# Patient Record
Sex: Female | Born: 2000
Health system: Southern US, Community
[De-identification: ages and names within clinical notes are randomized; demographics above are authoritative.]

## PROBLEM LIST (undated history)

## (undated) DIAGNOSIS — F32A Depression, unspecified: Secondary | ICD-10-CM

## (undated) DIAGNOSIS — M549 Dorsalgia, unspecified: Secondary | ICD-10-CM

## (undated) DIAGNOSIS — F319 Bipolar disorder, unspecified: Secondary | ICD-10-CM

## (undated) DIAGNOSIS — I10 Essential (primary) hypertension: Secondary | ICD-10-CM

## (undated) DIAGNOSIS — F419 Anxiety disorder, unspecified: Secondary | ICD-10-CM

## (undated) DIAGNOSIS — R7303 Prediabetes: Secondary | ICD-10-CM

## (undated) DIAGNOSIS — H52209 Unspecified astigmatism, unspecified eye: Secondary | ICD-10-CM

## (undated) DIAGNOSIS — R454 Irritability and anger: Secondary | ICD-10-CM

## (undated) DIAGNOSIS — F909 Attention-deficit hyperactivity disorder, unspecified type: Secondary | ICD-10-CM

## (undated) DIAGNOSIS — K5900 Constipation, unspecified: Secondary | ICD-10-CM

## (undated) DIAGNOSIS — E739 Lactose intolerance, unspecified: Secondary | ICD-10-CM

## (undated) DIAGNOSIS — F329 Major depressive disorder, single episode, unspecified: Secondary | ICD-10-CM

## (undated) HISTORY — DX: Lactose intolerance, unspecified: E73.9

## (undated) HISTORY — DX: Bipolar disorder, unspecified: F31.9

## (undated) HISTORY — DX: Essential (primary) hypertension: I10

## (undated) HISTORY — DX: Dorsalgia, unspecified: M54.9

## (undated) HISTORY — DX: Constipation, unspecified: K59.00

## (undated) HISTORY — PX: TONSILLECTOMY: SUR1361

---
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
## 2019-01-24 IMAGING — CT CT HEAD W/O CM
1 series · 16 of 30 positions shown, 20 images · non-contrast
Comparison: None.

CLINICAL DATA: Persistent headaches

EXAM:
CT HEAD WITHOUT CONTRAST
TECHNIQUE: Contiguous axial images were obtained from the base of the skull
through the vertex without intravenous contrast.

[Series 2: head w/(date) · axial · 0.49mm/px · z∈[-104,+46]mm · 16 of 34 slices shown, 20 images]
[im 2/34  brain]
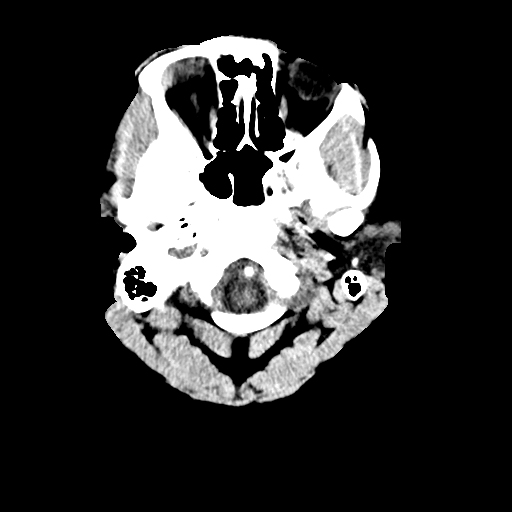
[im 2/34  bone]
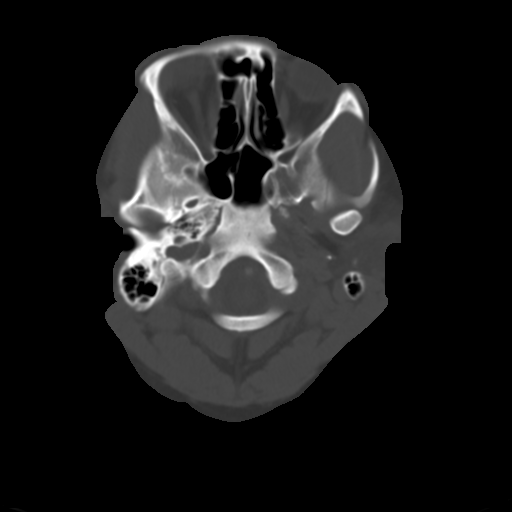
[im 4/34  brain]
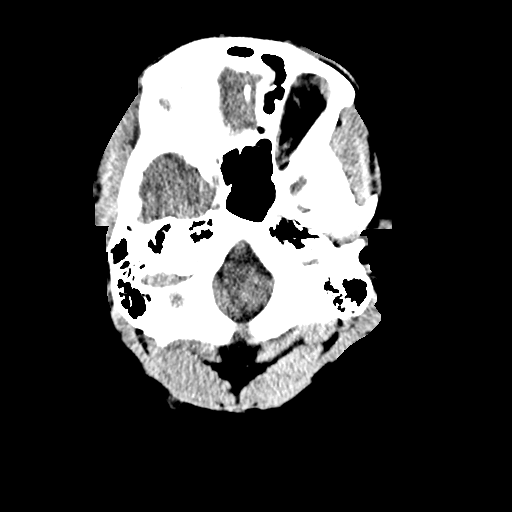
[im 6/34  brain]
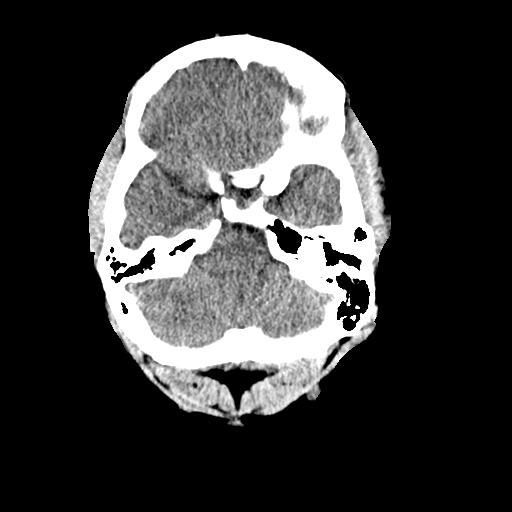
[im 8/34  brain]
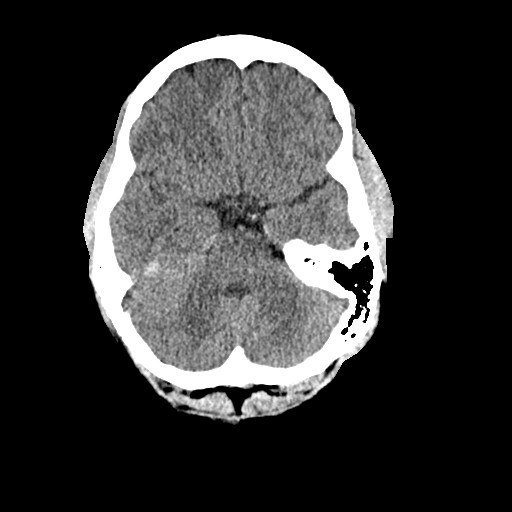
[im 10/34  brain]
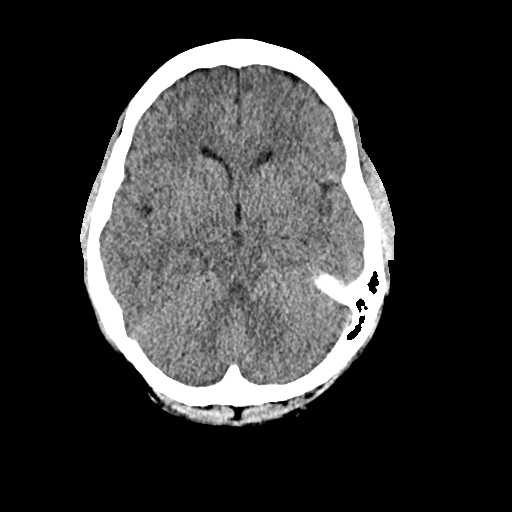
[im 10/34  bone]
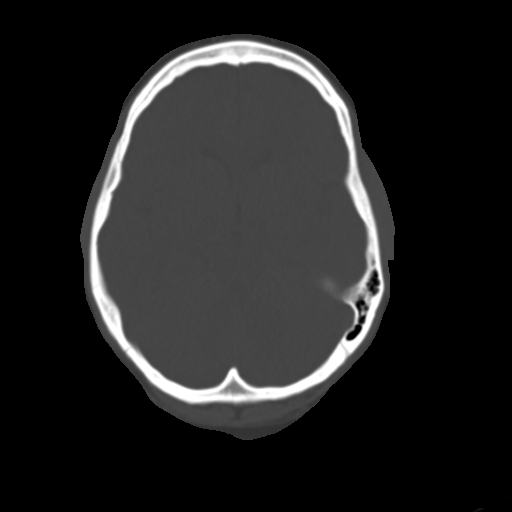
[im 12/34  brain]
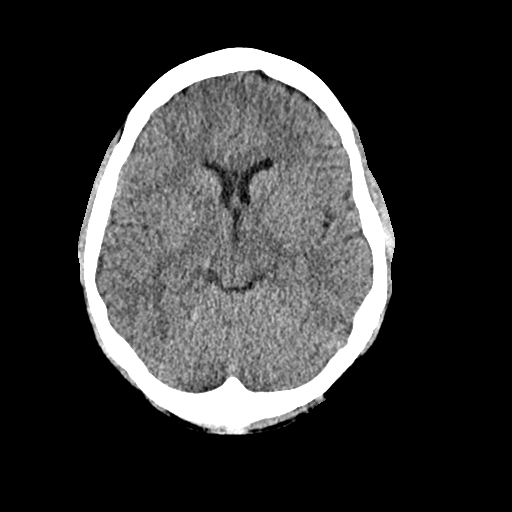
[im 14/34  brain]
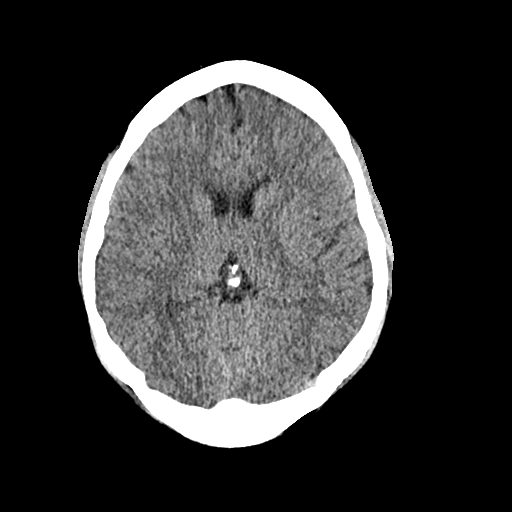
[im 16/34  brain]
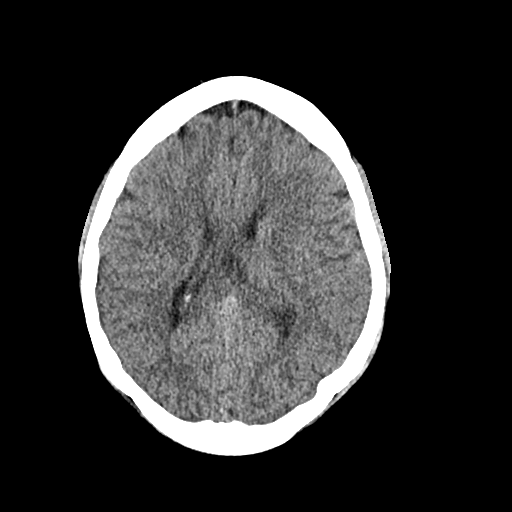
[im 18/34  brain]
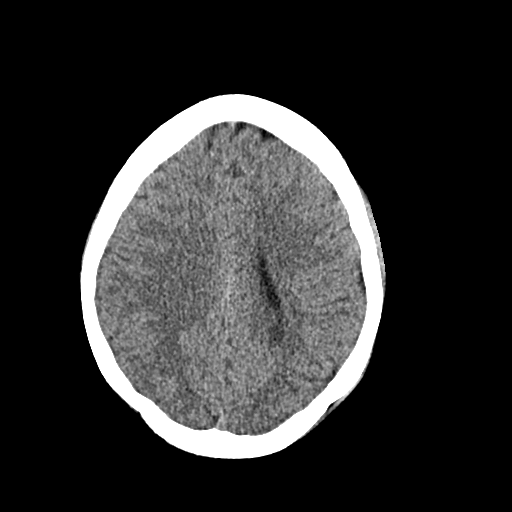
[im 18/34  bone]
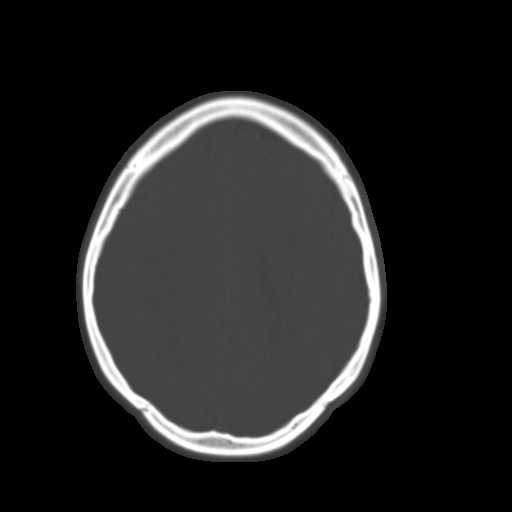
[im 20/34  brain]
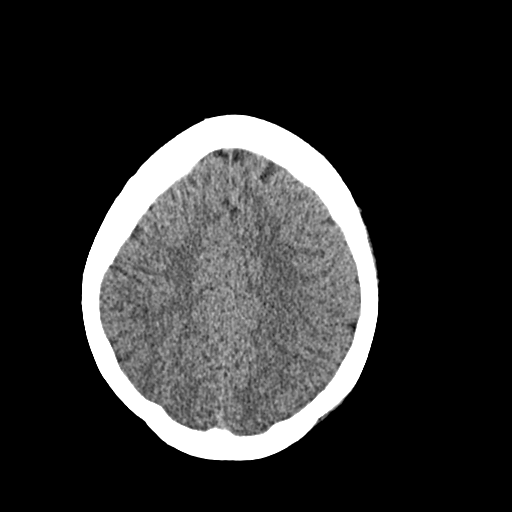
[im 22/34  brain]
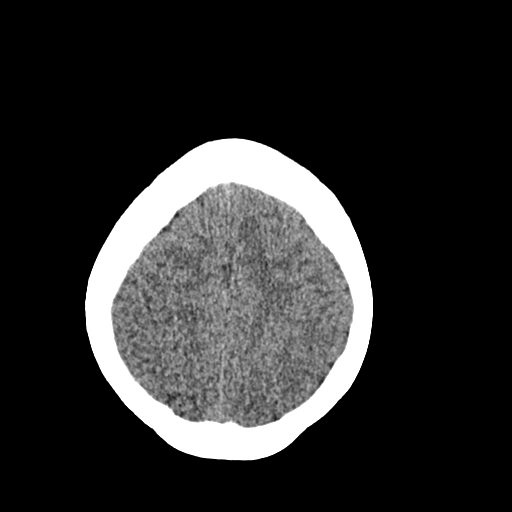
[im 24/34  brain]
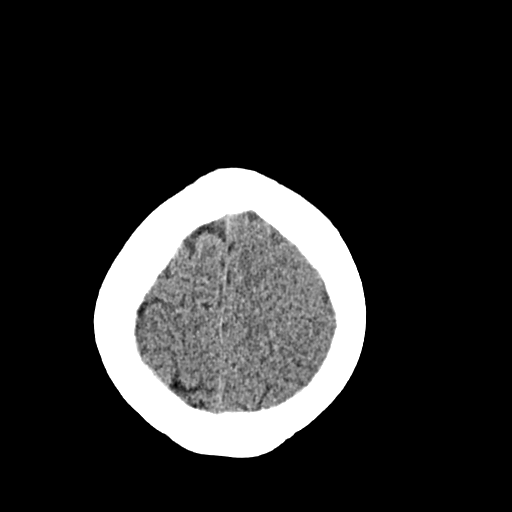
[im 26/34  brain]
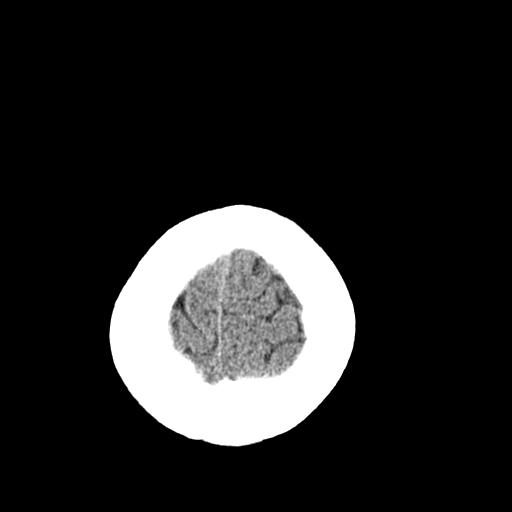
[im 26/34  bone]
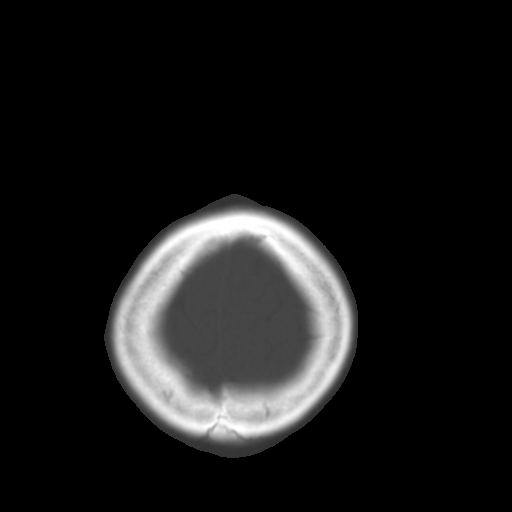
[im 28/34  brain]
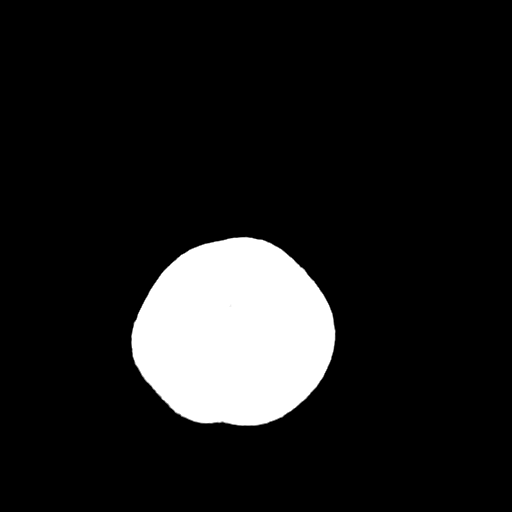
[im 30/34  brain]
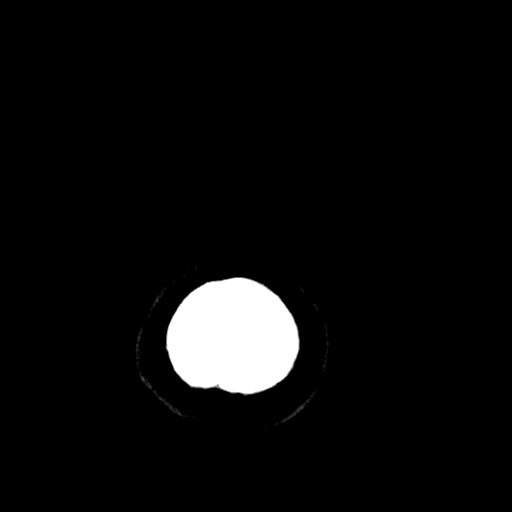
[im 32/34  brain]
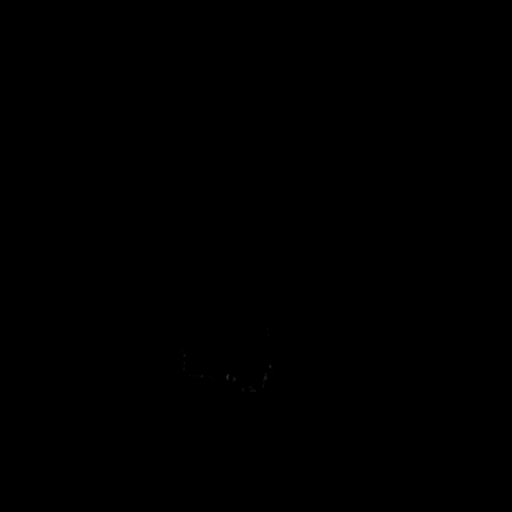

[16 of 30 positions shown; findings below may reference images not displayed]

FINDINGS: Brain: No evidence of acute infarction, hemorrhage, hydrocephalus,
extra-axial collection or mass lesion/mass effect.

Vascular: No hyperdense vessel or unexpected calcification.

Skull: Normal. Negative for fracture or focal lesion.

Sinuses/Orbits: No acute finding.

Other: None.
IMPRESSION: No acute intracranial abnormality noted.

## 2020-03-26 ENCOUNTER — Ambulatory Visit (INDEPENDENT_AMBULATORY_CARE_PROVIDER_SITE_OTHER): Payer: Medicare Other

## 2020-03-26 ENCOUNTER — Ambulatory Visit (HOSPITAL_COMMUNITY)
Admission: EM | Admit: 2020-03-26 | Discharge: 2020-03-26 | Disposition: A | Discharge status: Home / Self Care | Payer: Medicare Other

## 2020-03-26 ENCOUNTER — Encounter (HOSPITAL_COMMUNITY): Payer: Self-pay

## 2020-03-26 ENCOUNTER — Other Ambulatory Visit: Payer: Self-pay

## 2020-03-26 DIAGNOSIS — J069 Acute upper respiratory infection, unspecified: Secondary | ICD-10-CM

## 2020-03-26 DIAGNOSIS — R05 Cough: Secondary | ICD-10-CM | POA: Diagnosis not present

## 2020-03-26 MED ORDER — PROMETHAZINE-DM 6.25-15 MG/5ML PO SYRP: 5.0000 mL | ORAL_SOLUTION | Freq: Every evening | ORAL | 0 refills | Status: DC | PRN

## 2020-03-26 MED ORDER — BENZONATATE 100 MG PO CAPS: 100.0000 mg | ORAL_CAPSULE | Freq: Three times a day (TID) | ORAL | 0 refills | Status: DC | PRN

## 2020-03-26 MED ORDER — PREDNISONE 20 MG PO TABS: ORAL_TABLET | ORAL | 0 refills | Status: DC

## 2020-03-26 NOTE — ED Provider Notes (Signed)
[facility]   MRN: [id number] DOB: Nov 20, 2000  Subjective:   Misty Moses is a 19 y.o. female presenting for 3-week history of persistent dry cough, wheezing at night.  Also has throat discomfort.  Has been tested for Covid 19 twice and both were negative.  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  Denies history of asthma.  Denies being sexually active, no chance of pregnancy.  No current facility-administered medications for this encounter.  Current Outpatient Medications:  .  cloNIDine HCl (KAPVAY) 0.1 MG TB12 ER tablet, Take 1 tablet (0.1 mg total) by mouth 2 (two) times daily in the am and at bedtime.., Disp: 60 tablet, Rfl: 0 .  escitalopram (LEXAPRO) 10 MG tablet, Take 1 tablet (10 mg total) by mouth daily., Disp: 30 tablet, Rfl: 0 .  guanFACINE (INTUNIV) 1 MG TB24 ER tablet, Take 1 mg by mouth every morning., Disp: , Rfl:  .  Levonorgestrel (KYLEENA) 19.5 MG IUD, 19.5 mg by Intrauterine route once. Every 3 years - LAST INSERTED 2017, Disp: , Rfl:  .  metFORMIN (GLUCOPHAGE-XR) 500 MG 24 hr tablet, Take 1 tablet (500 mg total) by mouth daily with supper., Disp: 30 tablet, Rfl: 0 .  methylphenidate 36 MG PO CR tablet, Take 1 tablet (36 mg total) by mouth daily., Disp: 30 tablet, Rfl: 0 .  Oxcarbazepine (TRILEPTAL) 300 MG tablet, Take 1 tablet (300 mg total) by mouth 2 (two) times daily., Disp: 60 tablet, Rfl: 0 .  topiramate (TOPAMAX) 100 MG tablet, Take 200 mg by mouth 2 (two) times daily. , Disp: , Rfl: 2 .  traZODone (DESYREL) 100 MG tablet, Take 1 tablet (100 mg total) by mouth at bedtime., Disp: 30 tablet, Rfl: 0   Allergies  Allergen Reactions  . Hydrocodone Other (See Comments)    Dizziness    Past Medical History:  Diagnosis Date  . ADD (attention deficit disorder with hyperactivity)   . Anxiety   . Astigmatism   . Borderline diabetes    TAKES METFORMIN AS PRECAUTION  . Depression   . Excessive anger    EXPLOSIVE ANGER MANAGEMENT ISSUES      Past Surgical History:  Procedure Laterality Date  . ANTERIOR CRUCIATE LIGAMENT REPAIR Right 06/04/2017   Procedure: Right knee arthroscopic hamsting anterior cruciate ligament reconstruction with partial  medial menisectomy;  Surgeon: Yolonda Kida, MD;  Location[facility];  Service: Orthopedics;  Laterality: Right;  . TONSILLECTOMY      Family History  Problem Relation Age of Onset  . Migraines Mother   . Anxiety disorder Mother   . ADD / ADHD Mother   . Depression Mother   . Bipolar disorder Mother   . Seizures Neg Hx   . Autism Neg Hx   . Schizophrenia Neg Hx     Social History   Tobacco Use  . Smoking status: Never Smoker  . Smokeless tobacco: Never Used  Vaping Use  . Vaping Use: Never used  Substance Use Topics  . Alcohol use: No  . Drug use: No    ROS   Objective:   Vitals: BP 138/87   Pulse 86   Temp 98.7 F (37.1 C) (Oral)   Resp 18   Ht 5\' 4"  (1.626 m)   Wt 285 lb (129.3 kg)   SpO2 98%   BMI 48.92 kg/m   Physical Exam Constitutional:      General: She is not in acute distress.  Appearance: Normal appearance. She is well-developed. She is obese. She is not ill-appearing, toxic-appearing or diaphoretic.  HENT:     Head: Normocephalic and atraumatic.     Nose: Nose normal.     Mouth/Throat:     Mouth: Mucous membranes are moist.  Eyes:     Extraocular Movements: Extraocular movements intact.     Pupils: Pupils are equal, round, and reactive to light.  Cardiovascular:     Rate and Rhythm: Normal rate and regular rhythm.     Pulses: Normal pulses.     Heart sounds: Normal heart sounds. No murmur heard.  No friction rub. No gallop.   Pulmonary:     Effort: Pulmonary effort is normal. No respiratory distress.     Breath sounds: Normal breath sounds. No stridor. No wheezing, rhonchi or rales.  Skin:    General: Skin is warm and dry.     Findings: No rash.  Neurological:     Mental Status: She is alert and  oriented to person, place, and time.  Psychiatric:        Mood and Affect: Mood normal.        Behavior: Behavior normal.        Thought Content: Thought content normal.     DG Chest 2 View  Result Date: 03/26/2020 CLINICAL DATA:  Pt c/o non productive coughx3 wks. EXAM: CHEST - 2 VIEW COMPARISON:  Chest radiograph 09/15/2017 FINDINGS: The cardiomediastinal contours are within normal limits. The lungs are clear. No pneumothorax or pleural effusion. No acute finding in the visualized skeleton. IMPRESSION: No active cardiopulmonary disease. Electronically Signed   By: Emmaline Kluver M.D.   On: 03/26/2020 13:31    Assessment and Plan :   PDMP not reviewed this encounter.  1. Viral URI with cough     Defer respiratory panel given the timeframe of her illness, negative chest x-ray and very reassuring vital signs and physical exam findings.  Recommended cough suppression medications, prednisone course. Counseled patient on potential for adverse effects with medications prescribed/recommended today, ER and return-to-clinic precautions discussed, patient verbalized understanding.    Wallis Bamberg, PA-C 03/26/20 1343

## 2020-03-26 NOTE — ED Triage Notes (Signed)
Pt c/o non productive coughx3 wks. Pt states she feel like there is a "golf ball" in her throat. Pt states was tested for COVID twice and neg twice. 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 Pt states she has been wheezing at night.

## 2020-04-23 ENCOUNTER — Other Ambulatory Visit: Payer: Self-pay

## 2020-04-23 ENCOUNTER — Encounter (HOSPITAL_COMMUNITY): Payer: Self-pay | Admitting: *Deleted

## 2020-04-23 ENCOUNTER — Ambulatory Visit (HOSPITAL_COMMUNITY)
Admission: EM | Admit: 2020-04-23 | Discharge: 2020-04-23 | Disposition: A | Discharge status: Home / Self Care | Payer: Medicare Other | Attending: Family Medicine | Admitting: Family Medicine

## 2020-04-23 DIAGNOSIS — H6983 Other specified disorders of Eustachian tube, bilateral: Secondary | ICD-10-CM | POA: Diagnosis not present

## 2020-04-23 DIAGNOSIS — J3089 Other allergic rhinitis: Secondary | ICD-10-CM

## 2020-04-23 MED ORDER — MONTELUKAST SODIUM 10 MG PO TABS: 10.0000 mg | ORAL_TABLET | Freq: Every day | ORAL | 1 refills | Status: DC

## 2020-04-23 MED ORDER — AZELASTINE HCL 0.1 % NA SOLN: 1.0000 | Freq: Two times a day (BID) | NASAL | 1 refills | Status: DC

## 2020-04-23 MED ORDER — PREDNISONE 20 MG PO TABS: ORAL_TABLET | ORAL | 0 refills | Status: DC

## 2020-04-23 MED ORDER — BENZONATATE 100 MG PO CAPS: 100.0000 mg | ORAL_CAPSULE | Freq: Three times a day (TID) | ORAL | 0 refills | Status: DC | PRN

## 2020-04-23 NOTE — ED Triage Notes (Signed)
PT reports RT ear pain with out injury. Pt attempted to clean Rt ear with OTC but was unable to remove what she felt was wax in RT ear. Pain is 1/10 .

## 2020-04-23 NOTE — ED Provider Notes (Signed)
[facility]    CSN: [id number] Arrival date & time: 04/23/20  1005      History   Chief Complaint Chief Complaint  Patient presents with  . Otalgia    HPI Misty Moses is a 19 y.o. female.   Patient presenting today for ongoing right ear pressure and pain, rhinorrhea and sneezing for over a month now. No drainage from ear, muffled hearing, sharp stabbing pain. Was given prednisone tessalon and told to take antihistamines which did help but sxs returned. Denies fever, chills, body aches, wheezing, CP, SOB, HAs, dizziness. Currently still taking antihistamine but nothing else. No recent sick contacts.      Past Medical History:  Diagnosis Date  . ADD (attention deficit disorder with hyperactivity)   . Anxiety   . Astigmatism   . Borderline diabetes    TAKES METFORMIN AS PRECAUTION  . Depression   . Excessive anger    EXPLOSIVE ANGER MANAGEMENT ISSUES    Patient Active Problem List   Diagnosis Date Noted  . MDD (major depressive disorder), recurrent severe, without psychosis (HCC) 12/02/2017  . Gait disturbance 10/14/2017  . Weakness 10/14/2017  . Nonintractable episodic headache 07/16/2017  . New ACL tear, right, initial encounter 06/04/2017  . Acute medial meniscus tear of right knee 06/04/2017  . GAD (generalized anxiety disorder) 04/22/2013  . Central auditory processing disorder (CAPD) 04/22/2013  . Nexplanon insertion 03/16/2013  . Contraception 03/16/2013    Past Surgical History:  Procedure Laterality Date  . ANTERIOR CRUCIATE LIGAMENT REPAIR Right 06/04/2017   Procedure: Right knee arthroscopic hamsting anterior cruciate ligament reconstruction with partial  medial menisectomy;  Surgeon: Yolonda Kida, MD;  Location: [facility];  Service: Orthopedics;  Laterality: Right;  . TONSILLECTOMY      OB History    Gravida  0   Para  0   Term  0   Preterm  0   AB  0   Living  0     SAB  0   TAB  0   Ectopic   0   Multiple  0   Live Births               Home Medications    Prior to Admission medications   Medication Sig Start Date End Date Taking? Authorizing Provider  azelastine (ASTELIN) 0.1 % nasal spray Place 1 spray into both nostrils 2 (two) times daily. Use in each nostril as directed 04/23/20   Particia Nearing, PA-C  benzonatate (TESSALON) 100 MG capsule Take 1-2 capsules (100-200 mg total) by mouth 3 (three) times daily as needed. 04/23/20   Particia Nearing, PA-C  cloNIDine HCl (KAPVAY) 0.1 MG TB12 ER tablet Take 1 tablet (0.1 mg total) by mouth 2 (two) times daily in the am and at bedtime.. 12/09/17   Denzil Magnuson, NP  escitalopram (LEXAPRO) 10 MG tablet Take 1 tablet (10 mg total) by mouth daily. 12/10/17   Denzil Magnuson, NP  guanFACINE (INTUNIV) 1 MG TB24 ER tablet Take 1 mg by mouth every morning. 03/16/20   [provider]  Levonorgestrel (KYLEENA) 19.5 MG IUD 19.5 mg by Intrauterine route once. Every 3 years - LAST INSERTED 2017    [provider]  metFORMIN (GLUCOPHAGE-XR) 500 MG 24 hr tablet Take 1 tablet (500 mg total) by mouth daily with supper. 12/09/17   Denzil Magnuson, NP  methylphenidate 36 MG PO CR tablet Take 1 tablet (36 mg total) by mouth daily. 12/10/17  Denzil Magnuson, NP  montelukast (SINGULAIR) 10 MG tablet Take 1 tablet (10 mg total) by mouth at bedtime. 04/23/20   Particia Nearing, PA-C  Oxcarbazepine (TRILEPTAL) 300 MG tablet Take 1 tablet (300 mg total) by mouth 2 (two) times daily. 12/09/17   Denzil Magnuson, NP  predniSONE (DELTASONE) 20 MG tablet Take 2 tablets daily with breakfast. 04/23/20   Particia Nearing, PA-C  promethazine-dextromethorphan (PROMETHAZINE-DM) 6.25-15 MG/5ML syrup Take 5 mLs by mouth at bedtime as needed for cough. 03/26/20   Wallis Bamberg, PA-C  topiramate (TOPAMAX) 100 MG tablet Take 200 mg by mouth 2 (two) times daily.  02/03/17   [provider]  traZODone (DESYREL) 100 MG tablet  Take 1 tablet (100 mg total) by mouth at bedtime. 12/09/17   Denzil Magnuson, NP    Family History Family History  Problem Relation Age of Onset  . Migraines Mother   . Anxiety disorder Mother   . ADD / ADHD Mother   . Depression Mother   . Bipolar disorder Mother   . Seizures Neg Hx   . Autism Neg Hx   . Schizophrenia Neg Hx     Social History Social History   Tobacco Use  . Smoking status: Never Smoker  . Smokeless tobacco: Never Used  Vaping Use  . Vaping Use: Never used  Substance Use Topics  . Alcohol use: No  . Drug use: No     Allergies   Hydrocodone   Review of Systems Review of Systems PER HPI    Physical Exam Triage Vital Signs ED Triage Vitals  Enc Vitals Group     BP 04/23/20 1045 (!) 135/97     Pulse Rate 04/23/20 1045 88     Resp 04/23/20 1045 16     Temp 04/23/20 1045 98.1 F (36.7 C)     Temp Source 04/23/20 1045 Oral     SpO2 04/23/20 1045 91 %     Weight 04/23/20 1047 285 lb (129.3 kg)     Height 04/23/20 1047 5\' 4"  (1.626 m)     Head Circumference --      Peak Flow --      Pain Score 04/23/20 1046 1     Pain Loc --      Pain Edu? --      Excl. in GC? --    No data found.  Updated Vital Signs BP (!) 135/97 (BP Location: Right Arm)   Pulse 88   Temp 98.1 F (36.7 C) (Oral)   Resp 16   Ht 5\' 4"  (1.626 m)   Wt 285 lb (129.3 kg)   LMP 03/23/2020   SpO2 91%   BMI 48.92 kg/m   Visual Acuity Right Eye Distance:   Left Eye Distance:   Bilateral Distance:    Right Eye Near:   Left Eye Near:    Bilateral Near:     Physical Exam Vitals and nursing note reviewed.  Constitutional:      Appearance: Normal appearance. She is not ill-appearing.  HENT:     Head: Atraumatic.     Right Ear: Ear canal normal.     Left Ear: Ear canal normal.     Ears:     Comments: B/l middle ear effusion, worse on the right with TM injection.     Nose: Rhinorrhea present.     Mouth/Throat:     Mouth: Mucous membranes are moist.      Pharynx: Posterior oropharyngeal erythema present.  Eyes:  Extraocular Movements: Extraocular movements intact.     Conjunctiva/sclera: Conjunctivae normal.  Cardiovascular:     Rate and Rhythm: Normal rate and regular rhythm.     Heart sounds: Normal heart sounds.  Pulmonary:     Effort: Pulmonary effort is normal.     Breath sounds: Normal breath sounds.  Musculoskeletal:        General: Normal range of motion.     Cervical back: Normal range of motion and neck supple.  Skin:    General: Skin is warm and dry.  Neurological:     Mental Status: She is alert and oriented to person, place, and time.  Psychiatric:        Mood and Affect: Mood normal.        Thought Content: Thought content normal.        Judgment: Judgment normal.      UC Treatments / Results  Labs (all labs ordered are listed, but only abnormal results are displayed) Labs Reviewed - No data to display  EKG   Radiology No results found.  Procedures Procedures (including critical care time)  Medications Ordered in UC Medications - No data to display  Initial Impression / Assessment and Plan / UC Course  I have reviewed the triage vital signs and the nursing notes.  Pertinent labs & imaging results that were available during my care of the patient were reviewed by me and considered in my medical decision making (see chart for details).     Given her hx of allergic rhinitis and current sxs, suspect poorly controlled allergies causing ongoing sxs and eustachian tube dysfunction. Will restart prednisone burst and start astelin nasal spray in addition to adding singulair to antihistamine regimen. F/u with PCP for recheck and further refills if successful remission of sxs.    Final Clinical Impressions(s) / UC Diagnoses   Final diagnoses:  None     Discharge Instructions     Continue taking an over the counter antihistamine like zyrtec or allegra and start taking singulair daily as well as the  astelin nasal spray. Complete the prednisone as written and use tessalon as needed    ED Prescriptions    Medication Sig Dispense Auth. Provider   benzonatate (TESSALON) 100 MG capsule Take 1-2 capsules (100-200 mg total) by mouth 3 (three) times daily as needed. 60 capsule Particia Nearing, [place]   predniSONE (DELTASONE) 20 MG tablet Take 2 tablets daily with breakfast. 10 tablet Particia Nearing, PA-C   montelukast (SINGULAIR) 10 MG tablet Take 1 tablet (10 mg total) by mouth at bedtime. 30 tablet Particia Nearing, [place]   azelastine (ASTELIN) 0.1 % nasal spray Place 1 spray into both nostrils 2 (two) times daily. Use in each nostril as directed 30 mL Particia Nearing, PA-C     PDMP not reviewed this encounter.   Particia Nearing, [place] 04/23/20 1112

## 2020-04-23 NOTE — Discharge Instructions (Addendum)
Continue taking an over the counter antihistamine like zyrtec or allegra and start taking singulair daily as well as the astelin nasal spray. Complete the prednisone as written and use tessalon as needed

## 2020-05-04 DIAGNOSIS — Z8669 Personal history of other diseases of the nervous system and sense organs: Secondary | ICD-10-CM | POA: Insufficient documentation

## 2021-03-06 DIAGNOSIS — M25561 Pain in right knee: Secondary | ICD-10-CM | POA: Insufficient documentation

## 2021-03-08 DIAGNOSIS — M222X1 Patellofemoral disorders, right knee: Secondary | ICD-10-CM | POA: Insufficient documentation

## 2021-03-08 DIAGNOSIS — Z6841 Body Mass Index (BMI) 40.0 and over, adult: Secondary | ICD-10-CM | POA: Insufficient documentation

## 2021-03-08 DIAGNOSIS — E669 Obesity, unspecified: Secondary | ICD-10-CM | POA: Insufficient documentation

## 2021-03-22 ENCOUNTER — Encounter (HOSPITAL_COMMUNITY): Payer: Self-pay | Admitting: Emergency Medicine

## 2021-03-22 ENCOUNTER — Other Ambulatory Visit: Payer: Self-pay

## 2021-03-22 ENCOUNTER — Ambulatory Visit (HOSPITAL_COMMUNITY)
Admission: EM | Admit: 2021-03-22 | Discharge: 2021-03-22 | Disposition: A | Discharge status: Home / Self Care | Payer: Medicare Other | Attending: Emergency Medicine | Admitting: Emergency Medicine

## 2021-03-22 DIAGNOSIS — H1033 Unspecified acute conjunctivitis, bilateral: Secondary | ICD-10-CM

## 2021-03-22 MED ORDER — ERYTHROMYCIN 5 MG/GM OP OINT: TOPICAL_OINTMENT | OPHTHALMIC | 0 refills | Status: DC

## 2021-03-22 NOTE — ED Provider Notes (Signed)
[facility]    CSN: [id number] Arrival date & time: 03/22/21  7939      History   Chief Complaint Chief Complaint  Patient presents with   Conjunctivitis    HPI Misty Moses is a 20 y.o. female.   Patient here for evaluation of bilateral eye redness and discharge that has been ongoing since Tuesday.  Reports eyes are crusted in the morning.  Reports using OTC eyedrops and warm compresses with minimal relief.  Denies contact use and does not have an ophthalmologist that she sees regularly.  Denies any trauma, injury, or other precipitating event.  Denies any specific alleviating or aggravating factors.  Denies any fevers, chest pain, shortness of breath, N/V/D, numbness, tingling, weakness, abdominal pain, or headaches.    The history is provided by the patient.  Conjunctivitis   Past Medical History:  Diagnosis Date   ADD (attention deficit disorder with hyperactivity)    Anxiety    Astigmatism    Borderline diabetes    TAKES METFORMIN AS PRECAUTION   Depression    Excessive anger    EXPLOSIVE ANGER MANAGEMENT ISSUES    Patient Active Problem List   Diagnosis Date Noted   MDD (major depressive disorder), recurrent severe, without psychosis (HCC) 12/02/2017   Gait disturbance 10/14/2017   Weakness 10/14/2017   Nonintractable episodic headache 07/16/2017   New ACL tear, right, initial encounter 06/04/2017   Acute medial meniscus tear of right knee 06/04/2017   GAD (generalized anxiety disorder) 04/22/2013   Central auditory processing disorder (CAPD) 04/22/2013   Nexplanon insertion 03/16/2013   Contraception 03/16/2013    Past Surgical History:  Procedure Laterality Date   ANTERIOR CRUCIATE LIGAMENT REPAIR Right 06/04/2017   Procedure: Right knee arthroscopic hamsting anterior cruciate ligament reconstruction with partial  medial menisectomy;  Surgeon: Yolonda Kida, MD;  Location: [facility];  Service: Orthopedics;   Laterality: Right;   TONSILLECTOMY      OB History     Gravida  0   Para  0   Term  0   Preterm  0   AB  0   Living  0      SAB  0   IAB  0   Ectopic  0   Multiple  0   Live Births               Home Medications    Prior to Admission medications   Medication Sig Start Date End Date Taking? Authorizing Provider  erythromycin ophthalmic ointment Place a 1/2 inch ribbon of ointment into the lower eyelid. 03/22/21  Yes Ivette Loyal, NP  azelastine (ASTELIN) 0.1 % nasal spray Place 1 spray into both nostrils 2 (two) times daily. Use in each nostril as directed 04/23/20   Particia Nearing, PA-C  benzonatate (TESSALON) 100 MG capsule Take 1-2 capsules (100-200 mg total) by mouth 3 (three) times daily as needed. 04/23/20   Particia Nearing, PA-C  cloNIDine HCl (KAPVAY) 0.1 MG TB12 ER tablet Take 1 tablet (0.1 mg total) by mouth 2 (two) times daily in the am and at bedtime.. 12/09/17   Denzil Magnuson, NP  escitalopram (LEXAPRO) 10 MG tablet Take 1 tablet (10 mg total) by mouth daily. 12/10/17   Denzil Magnuson, NP  guanFACINE (INTUNIV) 1 MG TB24 ER tablet Take 1 mg by mouth every morning. 03/16/20   [provider]  levonorgestrel (KYLEENA) 19.5 MG IUD 19.5 mg by Intrauterine route once. Every 3  years - LAST INSERTED 2017    [provider]  metFORMIN (GLUCOPHAGE-XR) 500 MG 24 hr tablet Take 1 tablet (500 mg total) by mouth daily with supper. 12/09/17   Denzil Magnuson, NP  methylphenidate 36 MG PO CR tablet Take 1 tablet (36 mg total) by mouth daily. 12/10/17   Denzil Magnuson, NP  montelukast (SINGULAIR) 10 MG tablet Take 1 tablet (10 mg total) by mouth at bedtime. 04/23/20   Particia Nearing, PA-C  Oxcarbazepine (TRILEPTAL) 300 MG tablet Take 1 tablet (300 mg total) by mouth 2 (two) times daily. 12/09/17   Denzil Magnuson, NP  predniSONE (DELTASONE) 20 MG tablet Take 2 tablets daily with breakfast. 04/23/20   Particia Nearing, PA-C   promethazine-dextromethorphan (PROMETHAZINE-DM) 6.25-15 MG/5ML syrup Take 5 mLs by mouth at bedtime as needed for cough. 03/26/20   Wallis Bamberg, PA-C  topiramate (TOPAMAX) 100 MG tablet Take 200 mg by mouth 2 (two) times daily.  02/03/17   [provider]  traZODone (DESYREL) 100 MG tablet Take 1 tablet (100 mg total) by mouth at bedtime. 12/09/17   Denzil Magnuson, NP    Family History Family History  Problem Relation Age of Onset   Migraines Mother    Anxiety disorder Mother    ADD / ADHD Mother    Depression Mother    Bipolar disorder Mother    Seizures Neg Hx    Autism Neg Hx    Schizophrenia Neg Hx     Social History Social History   Tobacco Use   Smoking status: Never   Smokeless tobacco: Never  Vaping Use   Vaping Use: Never used  Substance Use Topics   Alcohol use: No   Drug use: No     Allergies   Hydrocodone   Review of Systems Review of Systems  Eyes:  Positive for pain, discharge, redness and itching. Negative for photophobia and visual disturbance.  All other systems reviewed and are negative.   Physical Exam Triage Vital Signs ED Triage Vitals [03/22/21 0919]  Enc Vitals Group     BP (!) 149/83     Pulse Rate 86     Resp 17     Temp 98 F (36.7 C)     Temp src      SpO2 98 %     Weight      Height      Head Circumference      Peak Flow      Pain Score      Pain Loc      Pain Edu?      Excl. in GC?    No data found.  Updated Vital Signs BP (!) 149/83   Pulse 86   Temp 98 F (36.7 C)   Resp 17   SpO2 98%   Visual Acuity Right Eye Distance:   Left Eye Distance:   Bilateral Distance:    Right Eye Near:   Left Eye Near:    Bilateral Near:     Physical Exam Vitals and nursing note reviewed.  Constitutional:      General: She is not in acute distress.    Appearance: Normal appearance. She is not ill-appearing, toxic-appearing or diaphoretic.  HENT:     Head: Normocephalic and atraumatic.  Eyes:     General: Lids  are normal. Lids are everted, no foreign bodies appreciated. Vision grossly intact. Gaze aligned appropriately.        Right eye: Discharge present. No foreign body.  Left eye: Discharge present.No foreign body.     Extraocular Movements: Extraocular movements intact.     Conjunctiva/sclera: Conjunctivae normal.  Cardiovascular:     Rate and Rhythm: Normal rate.     Pulses: Normal pulses.  Pulmonary:     Effort: Pulmonary effort is normal.  Abdominal:     General: Abdomen is flat.  Musculoskeletal:        General: Normal range of motion.     Cervical back: Normal range of motion.  Skin:    General: Skin is warm and dry.  Neurological:     General: No focal deficit present.     Mental Status: She is alert and oriented to person, place, and time.  Psychiatric:        Mood and Affect: Mood normal.     UC Treatments / Results  Labs (all labs ordered are listed, but only abnormal results are displayed) Labs Reviewed - No data to display  EKG   Radiology No results found.  Procedures Procedures (including critical care time)  Medications Ordered in UC Medications - No data to display  Initial Impression / Assessment and Plan / UC Course  I have reviewed the triage vital signs and the nursing notes.  Pertinent labs & imaging results that were available during my care of the patient were reviewed by me and considered in my medical decision making (see chart for details).    Assessment negative for red flags or concerns.  Likely bilateral conjunctivitis.  Will treat with erythromycin ointment for the next 7 days.  May continue to use a warm compress as needed for comfort.  Instructed patient to wash pillow cases, compresses, hand, and anything else frequently touched.  Follow up with opthalmology for re-evaluation.   Final Clinical Impressions(s) / UC Diagnoses   Final diagnoses:  Acute bacterial conjunctivitis of both eyes     Discharge Instructions      Apply  the erythromycin ointment 1/2 inch to the lower eyelid of both eyes for the next 7 days.    Wash pillow cases, wash hands regularly with soap and water, avoid touching your face and eyes, wash door handles, light switches, remotes and other objects you frequently touch  Return or follow up with PCP if symptoms persists such as fever, chills, redness, swelling, eye pain, painful eye movements, vision changes.   Follow up with ophthalmology (eye doctor) for re-evaluation.       ED Prescriptions     Medication Sig Dispense Auth. Provider   erythromycin ophthalmic ointment Place a 1/2 inch ribbon of ointment into the lower eyelid. 3.5 g Ivette Loyal, NP      PDMP not reviewed this encounter.   Ivette Loyal, NP 03/22/21 [phone]

## 2021-03-22 NOTE — ED Triage Notes (Signed)
Pt is present today with bilateral eye discomfort, drainage, and swelling. Pt states that she noticed her sx Tuesday.

## 2021-03-22 NOTE — Discharge Instructions (Addendum)
Apply the erythromycin ointment 1/2 inch to the lower eyelid of both eyes for the next 7 days.    Wash pillow cases, wash hands regularly with soap and water, avoid touching your face and eyes, wash door handles, light switches, remotes and other objects you frequently touch  Return or follow up with PCP if symptoms persists such as fever, chills, redness, swelling, eye pain, painful eye movements, vision changes.   Follow up with ophthalmology (eye doctor) for re-evaluation.

## 2021-04-12 ENCOUNTER — Ambulatory Visit (HOSPITAL_COMMUNITY)
Admission: EM | Admit: 2021-04-12 | Discharge: 2021-04-12 | Disposition: A | Discharge status: Home / Self Care | Payer: Medicare Other | Attending: Behavioral Health | Admitting: Behavioral Health

## 2021-04-12 ENCOUNTER — Other Ambulatory Visit: Payer: Self-pay

## 2021-04-12 DIAGNOSIS — Z79899 Other long term (current) drug therapy: Secondary | ICD-10-CM | POA: Diagnosis not present

## 2021-04-12 DIAGNOSIS — F332 Major depressive disorder, recurrent severe without psychotic features: Secondary | ICD-10-CM | POA: Insufficient documentation

## 2021-04-12 DIAGNOSIS — R454 Irritability and anger: Secondary | ICD-10-CM | POA: Diagnosis not present

## 2021-04-12 MED ORDER — TRAZODONE HCL 150 MG PO TABS: 150.0000 mg | ORAL_TABLET | Freq: Every evening | ORAL | 0 refills | Status: DC | PRN
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
## 2021-04-12 NOTE — Discharge Instructions (Addendum)
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
## 2021-04-12 NOTE — Progress Notes (Signed)
Misty Moses received her AVS, questions answered and she was discharged with her mom without incident.

## 2021-05-24 ENCOUNTER — Ambulatory Visit (INDEPENDENT_AMBULATORY_CARE_PROVIDER_SITE_OTHER): Payer: Medicare Other | Admitting: Family Medicine

## 2021-05-24 ENCOUNTER — Other Ambulatory Visit (INDEPENDENT_AMBULATORY_CARE_PROVIDER_SITE_OTHER): Payer: Self-pay | Admitting: Family Medicine

## 2021-05-24 ENCOUNTER — Encounter (INDEPENDENT_AMBULATORY_CARE_PROVIDER_SITE_OTHER): Payer: Self-pay | Admitting: Family Medicine

## 2021-05-24 ENCOUNTER — Other Ambulatory Visit: Payer: Self-pay

## 2021-05-24 VITALS — BP 131/82 | HR 90 | Temp 98.0°F | Ht 63.0 in | Wt 291.0 lb

## 2021-05-24 DIAGNOSIS — E559 Vitamin D deficiency, unspecified: Secondary | ICD-10-CM

## 2021-05-24 DIAGNOSIS — R739 Hyperglycemia, unspecified: Secondary | ICD-10-CM

## 2021-05-24 DIAGNOSIS — F319 Bipolar disorder, unspecified: Secondary | ICD-10-CM

## 2021-05-24 DIAGNOSIS — R5383 Other fatigue: Secondary | ICD-10-CM | POA: Diagnosis not present

## 2021-05-24 DIAGNOSIS — D509 Iron deficiency anemia, unspecified: Secondary | ICD-10-CM | POA: Diagnosis not present

## 2021-05-24 DIAGNOSIS — R030 Elevated blood-pressure reading, without diagnosis of hypertension: Secondary | ICD-10-CM

## 2021-05-24 DIAGNOSIS — Z6841 Body Mass Index (BMI) 40.0 and over, adult: Secondary | ICD-10-CM

## 2021-05-24 DIAGNOSIS — Z1331 Encounter for screening for depression: Secondary | ICD-10-CM | POA: Diagnosis not present

## 2021-05-24 DIAGNOSIS — E669 Obesity, unspecified: Secondary | ICD-10-CM

## 2021-05-24 DIAGNOSIS — R0602 Shortness of breath: Secondary | ICD-10-CM

## 2021-05-25 LAB — ANEMIA PANEL
Ferritin: 45 ng/mL (ref 15–150)
Folate, Hemolysate: 195 ng/mL
Folate, RBC: 589 ng/mL (ref >498)
Hematocrit: 33.1 % — ABNORMAL LOW (ref 34.0–46.6)
Iron Saturation: 6 % — CL (ref 15–55)
Iron: 21 ug/dL — ABNORMAL LOW (ref 27–159)
Retic Ct Pct: 1.4 % (ref 0.6–2.6)
Total Iron Binding Capacity: 347 ug/dL (ref 250–450)
UIBC: 326 ug/dL (ref 131–425)
Vitamin B-12: 339 pg/mL (ref 232–1245)

## 2021-05-25 LAB — COMPREHENSIVE METABOLIC PANEL
ALT: 12 IU/L (ref 0–32)
AST: 14 IU/L (ref 0–40)
Albumin/Globulin Ratio: 1.6 (ref 1.2–2.2)
Albumin: 4.5 g/dL (ref 3.9–5.0)
Alkaline Phosphatase: 98 IU/L (ref 44–121)
BUN/Creatinine Ratio: 28 — ABNORMAL HIGH (ref 9–23)
BUN: 26 mg/dL — ABNORMAL HIGH (ref 6–20)
Bilirubin Total: <0.2 mg/dL (ref 0.0–1.2)
CO2: 16 mmol/L — ABNORMAL LOW (ref 20–29)
Calcium: 9.6 mg/dL (ref 8.7–10.2)
Chloride: 105 mmol/L (ref 96–106)
Creatinine, Ser: 0.93 mg/dL (ref 0.57–1.00)
Globulin, Total: 2.9 g/dL (ref 1.5–4.5)
Glucose: 79 mg/dL (ref 70–99)
Potassium: 4.2 mmol/L (ref 3.5–5.2)
Sodium: 139 mmol/L (ref 134–144)
Total Protein: 7.4 g/dL (ref 6.0–8.5)
eGFR: 90 mL/min/{1.73_m2} (ref >59)

## 2021-05-25 LAB — CBC WITH DIFFERENTIAL/PLATELET
Basophils Absolute: 0 10*3/uL (ref 0.0–0.2)
Basos: 0 %
EOS (ABSOLUTE): 0.3 10*3/uL (ref 0.0–0.4)
Eos: 3 %
Hematocrit: 36.2 % (ref 34.0–46.6)
Hemoglobin: 10.4 g/dL — ABNORMAL LOW (ref 11.1–15.9)
Immature Grans (Abs): 0 10*3/uL (ref 0.0–0.1)
Immature Granulocytes: 0 %
Lymphocytes Absolute: 2 10*3/uL (ref 0.7–3.1)
Lymphs: 17 %
MCH: 20 pg — ABNORMAL LOW (ref 26.6–33.0)
MCHC: 28.7 g/dL — ABNORMAL LOW (ref 31.5–35.7)
MCV: 70 fL — ABNORMAL LOW (ref 79–97)
Monocytes Absolute: 0.6 10*3/uL (ref 0.1–0.9)
Monocytes: 5 %
Neutrophils Absolute: 8.7 10*3/uL — ABNORMAL HIGH (ref 1.4–7.0)
Neutrophils: 75 %
Platelets: 414 10*3/uL (ref 150–450)
RBC: 5.21 x10E6/uL (ref 3.77–5.28)
RDW: 16.3 % — ABNORMAL HIGH (ref 11.7–15.4)
WBC: 11.7 10*3/uL — ABNORMAL HIGH (ref 3.4–10.8)

## 2021-05-25 LAB — LIPID PANEL WITH LDL/HDL RATIO
Cholesterol, Total: 142 mg/dL (ref 100–199)
HDL: 48 mg/dL (ref >39)
LDL Chol Calc (NIH): 82 mg/dL (ref 0–99)
LDL/HDL Ratio: 1.7 ratio (ref 0.0–3.2)
Triglycerides: 59 mg/dL (ref 0–149)
VLDL Cholesterol Cal: 12 mg/dL (ref 5–40)

## 2021-05-25 LAB — TSH: TSH: 0.899 u[IU]/mL (ref 0.450–4.500)

## 2021-05-25 LAB — T4, FREE: Free T4: 1.15 ng/dL (ref 0.82–1.77)

## 2021-05-25 LAB — T3: T3, Total: 121 ng/dL (ref 71–180)

## 2021-05-25 LAB — INSULIN, RANDOM: INSULIN: 33.8 u[IU]/mL — ABNORMAL HIGH (ref 2.6–24.9)

## 2021-05-25 LAB — HEMOGLOBIN A1C
Est. average glucose Bld gHb Est-mCnc: 117 mg/dL
Hgb A1c MFr Bld: 5.7 % — ABNORMAL HIGH (ref 4.8–5.6)

## 2021-05-25 LAB — VITAMIN D 25 HYDROXY (VIT D DEFICIENCY, FRACTURES): Vit D, 25-Hydroxy: 20 ng/mL — ABNORMAL LOW (ref 30.0–100.0)

## 2021-05-28 NOTE — Progress Notes (Signed)
Dear Dr. Duwayne Heck,   Thank you for referring Misty Moses to our clinic. The following note includes my evaluation and treatment recommendations.  Chief Complaint:   OBESITY Misty Moses (MR# [id number]) is a 20 y.o. female who presents for evaluation and treatment of obesity and related comorbidities. Current BMI is Body mass index is 51.55 kg/m. Misty Moses has been struggling with her weight for many years and has been unsuccessful in either losing weight, maintaining weight loss, or reaching her healthy weight goal.  Misty Moses is currently in the action stage of change and ready to dedicate time achieving and maintaining a healthier weight. Misty Moses is interested in becoming our patient and working on intensive lifestyle modifications including (but not limited to) diet and exercise for weight loss.  Referred by Dr. Duwayne Heck. 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 She is eating out daily- eats chicken. She is lactose sensitive and skips breakfast daily. Lunch is a leftover of pasta with tomato sauce with ground beef (1/2 cup) with soda (Coke) (felt full); Dinner- [employer] 8 pieces with soda (felt full).  Misty Moses's habits were reviewed today and are as follows: her desired weight loss is 141 lbs, she started gaining weight 6 years ago, her heaviest weight ever was 305 pounds, she is a picky eater and doesn't like to eat healthier foods, she has significant food cravings issues, she wakes up frequently in the middle of the night to eat, she is frequently drinking liquids with calories, she frequently makes poor food choices, she frequently eats larger portions than normal, she has binge eating behaviors, and she struggles with emotional eating.  Depression Screen [place]'s Food and Mood (modified PHQ-9) score was 20.  Depression screen PHQ 2/9 05/24/2021  Decreased Interest 3  Down, Depressed, Hopeless 3  PHQ - 2 Score 6  Altered sleeping 2  Tired, decreased  energy 3  Change in appetite 1  Feeling bad or failure about yourself  1  Trouble concentrating 1  Moving slowly or fidgety/restless 3  Suicidal thoughts 3  PHQ-9 Score 20  Difficult doing work/chores Somewhat difficult   Subjective:   1. Other fatigue Misty Moses admits to daytime somnolence and admits to waking up still tired. Patent has a history of symptoms of daytime fatigue, morning fatigue, and morning headache. Misty Moses generally gets 6 hours of sleep per night, and states that she has generally restful sleep. Snoring is not present. Apneic episodes are not present. Epworth Sleepiness Score is 6. EKG normal sinus rhythm at 86 bpm.  2. SOB (shortness of breath) Misty Moses notes increasing shortness of breath with exercising and seems to be worsening over time with weight gain. She notes getting out of breath sooner with activity than she used to. This has gotten worse recently. Misty Moses denies shortness of breath at rest or orthopnea. EKG normal sinus rhythm at 86 bpm.  3. Hyperglycemia Pt has a reported diagnosis of prediabetes. Her last A1c was 5.5 in 2014.  4. Bipolar affective disorder, remission status unspecified (HCC) Pt is seen by [employer]. She is on Trileptal, topiramate, and Lexapro.  5. Elevated BP without diagnosis of hypertension BP within normal limits today. Pt has no diagnosis of hypertension.  6. Microcytic anemia Pt is not on iron supplementation. Her last CBC was in 2019.  7. Vitamin D deficiency Pt is not on a Vit D replacement and reports fatigue. Diagnosis likely given obesity.  Assessment/Plan:   1. Other fatigue Bijal does feel  that her weight is causing her energy to be lower than it should be. Fatigue may be related to obesity, depression or many other causes. Labs will be ordered, and in the meanwhile, Misty Moses will focus on self care including making healthy food choices, increasing physical activity and focusing on stress reduction. Check labs  today.  - EKG 12-Lead - T3 - T4, free - TSH  2. SOB (shortness of breath) Misty Moses does feel that she gets out of breath more easily that she used to when she exercises. Misty Moses's shortness of breath appears to be obesity related and exercise induced. She has agreed to work on weight loss and gradually increase exercise to treat her exercise induced shortness of breath. Will continue to monitor closely. Check labs today.  - Lipid Panel With LDL/HDL Ratio  3. Hyperglycemia Check labs today.  - Hemoglobin A1c - Insulin, random  4. Bipolar affective disorder, remission status unspecified (HCC) Follow up with Wellness Intervention.  5. Elevated BP without diagnosis of hypertension Misty Moses is working on healthy weight loss and exercise to improve blood pressure control. We will watch for signs of hypotension as she continues her lifestyle modifications. Check labs today.  - Comprehensive metabolic panel  6. Microcytic anemia Check labs today.  - CBC with Differential/Platelet - Anemia panel  7. Vitamin D deficiency Low Vitamin D level contributes to fatigue and are associated with obesity, breast, and colon cancer. She will follow-up for routine testing of Vitamin D, at least 2-3 times per year to avoid over-replacement. Check labs today.  - VITAMIN D 25 Hydroxy (Vit-D Deficiency, Fractures)  8. Depression screening Misty Moses had a positive depression screening. Depression is commonly associated with obesity and often results in emotional eating behaviors. We will monitor this closely and work on CBT to help improve the non-hunger eating patterns. Referral to Psychology may be required if no improvement is seen as she continues in our clinic.  9. Obesity with current BMI of 51.6  Misty Moses is currently in the action stage of change and her goal is to continue with weight loss efforts. I recommend Misty Moses begin the structured treatment plan as follows:  She has agreed to the Category 4  Plan.  Exercise goals: No exercise has been prescribed at this time.   Behavioral modification strategies: increasing lean protein intake, meal planning and cooking strategies, and keeping healthy foods in the home.  She was informed of the importance of frequent follow-up visits to maximize her success with intensive lifestyle modifications for her multiple health conditions. She was informed we would discuss her lab results at her next visit unless there is a critical issue that needs to be addressed sooner. Kennedie agreed to keep her next visit at the agreed upon time to discuss these results.  Objective:   Blood pressure 131/82, pulse 90, temperature 98 F (36.7 C), height 5\' 3"  (1.6 m), weight 291 lb (132 kg), last menstrual period 05/22/2021, SpO2 99 %. Body mass index is 51.55 kg/m.  EKG: Normal sinus rhythm, rate 86.  Indirect Calorimeter completed today shows a VO2 of 340 and a REE of 2347.  Her calculated basal metabolic rate is 05/24/2021 thus her basal metabolic rate is better than expected.  General: Cooperative, alert, well developed, in no acute distress. HEENT: Conjunctivae and lids unremarkable. Cardiovascular: Regular rhythm.  Lungs: Normal work of breathing. Neurologic: No focal deficits.   Lab Results  Component Value Date   CREATININE 0.93 05/24/2021   BUN 26 (H) 05/24/2021  NA 139 05/24/2021   K 4.2 05/24/2021   CL 105 05/24/2021   CO2 16 (L) 05/24/2021   Lab Results  Component Value Date   ALT 12 05/24/2021   AST 14 05/24/2021   ALKPHOS 98 05/24/2021   BILITOT <0.2 05/24/2021   Lab Results  Component Value Date   HGBA1C 5.7 (H) 05/24/2021   HGBA1C 5.5 04/22/2013   Lab Results  Component Value Date   INSULIN 33.8 (H) 05/24/2021   Lab Results  Component Value Date   TSH 0.899 05/24/2021   Lab Results  Component Value Date   CHOL 142 05/24/2021   HDL 48 05/24/2021   LDLCALC 82 05/24/2021   TRIG 59 05/24/2021   CHOLHDL 2.7 04/22/2013   Lab  Results  Component Value Date   WBC 11.7 (H) 05/24/2021   HGB 10.4 (L) 05/24/2021   HCT 33.1 (L) 05/24/2021   MCV 70 (L) 05/24/2021   PLT 414 05/24/2021   Lab Results  Component Value Date   IRON 21 (L) 05/24/2021   TIBC 347 05/24/2021   FERRITIN 45 05/24/2021   Attestation Statements:   Reviewed by clinician on day of visit: allergies, medications, problem list, medical history, surgical history, family history, social history, and previous encounter notes.  Edmund Hilda, CMA, am acting as transcriptionist for Reuben Likes, MD.   This is the patient's first visit at [facility]. The patient's NEW PATIENT PACKET was reviewed at length. Included in the packet: current and past health history, medications, allergies, ROS, gynecologic history (women only), surgical history, family history, social history, weight history, weight loss surgery history (for those that have had weight loss surgery), nutritional evaluation, mood and food questionnaire, PHQ9, Epworth questionnaire, sleep habits questionnaire, patient life and health improvement goals questionnaire. These will all be scanned into the patient's chart under media.   During the visit, I independently reviewed the patient's EKG, bioimpedance scale results, and indirect calorimeter results. I used this information to tailor a meal plan for the patient that will help her to lose weight and will improve her obesity-related conditions going forward. I performed a medically necessary appropriate examination and/or evaluation. I discussed the assessment and treatment plan with the patient. The patient was provided an opportunity to ask questions and all were answered. The patient agreed with the plan and demonstrated an understanding of the instructions. Labs were ordered at this visit and will be reviewed at the next visit unless more critical results need to be addressed immediately. Clinical information was updated and  documented in the EMR.   Time spent on visit including pre-visit chart review and post-visit care was 60 minutes.   I have reviewed the above documentation for accuracy and completeness, and I agree with the above. - Reuben Likes, MD

## 2021-06-07 ENCOUNTER — Encounter (INDEPENDENT_AMBULATORY_CARE_PROVIDER_SITE_OTHER): Payer: Self-pay | Admitting: Family Medicine

## 2021-06-07 ENCOUNTER — Ambulatory Visit (INDEPENDENT_AMBULATORY_CARE_PROVIDER_SITE_OTHER): Payer: Medicare Other | Admitting: Family Medicine

## 2021-06-07 ENCOUNTER — Other Ambulatory Visit: Payer: Self-pay

## 2021-06-07 VITALS — BP 136/83 | HR 88 | Temp 98.3°F | Ht 63.0 in | Wt 295.0 lb

## 2021-06-07 DIAGNOSIS — Z6841 Body Mass Index (BMI) 40.0 and over, adult: Secondary | ICD-10-CM

## 2021-06-07 DIAGNOSIS — D508 Other iron deficiency anemias: Secondary | ICD-10-CM | POA: Diagnosis not present

## 2021-06-07 DIAGNOSIS — E559 Vitamin D deficiency, unspecified: Secondary | ICD-10-CM | POA: Diagnosis not present

## 2021-06-07 DIAGNOSIS — R7303 Prediabetes: Secondary | ICD-10-CM

## 2021-06-07 MED ORDER — METFORMIN HCL 500 MG PO TABS: 500.0000 mg | ORAL_TABLET | Freq: Every day | ORAL | 0 refills | Status: DC

## 2021-06-07 MED ORDER — VITAMIN D (ERGOCALCIFEROL) 1.25 MG (50000 UNIT) PO CAPS: 50000.0000 [IU] | ORAL_CAPSULE | ORAL | 0 refills | Status: DC

## 2021-06-07 NOTE — Progress Notes (Signed)
Chief Complaint:   OBESITY Misty Moses is here to discuss her progress with her obesity treatment plan along with follow-up of her obesity related diagnoses. Misty Moses is on the Category 4 Plan and states she is following her eating plan approximately 80% of the time. Misty Moses states she is walking at work 8,000-9,000 steps.  Today's visit was #: 2 Starting weight: 291 lbs Starting date: 05/24/2021 Today's weight: 295 lbs Today's date: 06/07/2021 Total lbs lost to date: 0 Total lbs lost since last in-office visit: 0  Interim History: Misty Moses felt the meal plan was a bit difficult. She tried to incorporate meal plan into her daily food intake. She would skip break breakfast daily. Lunch would be home cooked meal at grandma's house- 2 drumsticks, tomatoes, rice, and soda. Dinner would be leftovers. Pt is working up to Thanksgiving and then doing family gathering for the holiday.  Subjective:   1. Other iron deficiency anemia Pt's MCV is 70, iron 21, ferritin 45, iron saturation 6, hemoglobin 10.4, and hematocrit 33.1.  2. Vitamin D deficiency Misty Moses's Vit D level is 20.0 and she reports fatigue. Pt is not on a Vit D supplement.  3. Pre-diabetes Her A1c is 5.7 and insulin level 33.8. Pt is on Metformin with no GI issues.  Assessment/Plan:   1. Other iron deficiency anemia Skilar is to start OTC prenatal or multivitamin. Orders and follow up as documented in patient record.   Counseling Iron is essential for our bodies to make red blood cells.  Reasons that someone may be deficient include: an iron-deficient diet (more likely in those following vegan or vegetarian diets), women with heavy menses, patients with GI disorders or poor absorption, patients that have had bariatric surgery, frequent blood donors, patients with cancer, and patients with heart disease.   An iron supplement has been recommended. This is found over-the-counter.  Iron-rich foods include dark leafy greens, red and  white meats, eggs, seafood, and beans.   Certain foods and drinks prevent your body from absorbing iron properly. Avoid eating these foods in the same meal as iron-rich foods or with iron supplements. These foods include: coffee, black tea, and red wine; milk, dairy products, and foods that are high in calcium; beans and soybeans; whole grains.  Constipation can be a side effect of iron supplementation. Increased water and fiber intake are helpful. Water goal: > 2 liters/day. Fiber goal: > 25 grams/day.  2. Vitamin D deficiency Low Vitamin D level contributes to fatigue and are associated with obesity, breast, and colon cancer. She agrees to start taking prescription Vitamin D 50,000 IU every week and will follow-up for routine testing of Vitamin D, at least 2-3 times per year to avoid over-replacement.  Start- Vitamin D, Ergocalciferol, (DRISDOL) 1.25 MG (50000 UNIT) CAPS capsule; Take 1 capsule (50,000 Units total) by mouth every 7 (seven) days.  Dispense: 4 capsule; Refill: 0  3. Pre-diabetes Misty Moses will continue to work on weight loss, exercise, and decreasing simple carbohydrates to help decrease the risk of diabetes. Repeat labs in 3 months.  Start- metFORMIN (GLUCOPHAGE) 500 MG tablet; Take 1 tablet (500 mg total) by mouth daily with breakfast.  Dispense: 30 tablet; Refill: 0  4. Obesity with current BMI of 52.3  Mc is currently in the action stage of change. As such, her goal is to continue with weight loss efforts. She has agreed to the Category 4 Plan.   Exercise goals: All adults should avoid inactivity. Some physical activity is better than  none, and adults who participate in any amount of physical activity gain some health benefits.  Behavioral modification strategies: increasing lean protein intake, no skipping meals, meal planning and cooking strategies, and keeping healthy foods in the home.  Misty Moses has agreed to follow-up with our clinic in 2-3 weeks. She was informed of  the importance of frequent follow-up visits to maximize her success with intensive lifestyle modifications for her multiple health conditions.   Objective:   Blood pressure 136/83, pulse 88, temperature 98.3 F (36.8 C), height 5\' 3"  (1.6 m), weight 295 lb (133.8 kg), last menstrual period 05/22/2021, SpO2 98 %. Body mass index is 52.26 kg/m.  General: Cooperative, alert, well developed, in no acute distress. HEENT: Conjunctivae and lids unremarkable. Cardiovascular: Regular rhythm.  Lungs: Normal work of breathing. Neurologic: No focal deficits.   Lab Results  Component Value Date   CREATININE 0.93 05/24/2021   BUN 26 (H) 05/24/2021   NA 139 05/24/2021   K 4.2 05/24/2021   CL 105 05/24/2021   CO2 16 (L) 05/24/2021   Lab Results  Component Value Date   ALT 12 05/24/2021   AST 14 05/24/2021   ALKPHOS 98 05/24/2021   BILITOT <0.2 05/24/2021   Lab Results  Component Value Date   HGBA1C 5.7 (H) 05/24/2021   HGBA1C 5.5 04/22/2013   Lab Results  Component Value Date   INSULIN 33.8 (H) 05/24/2021   Lab Results  Component Value Date   TSH 0.899 05/24/2021   Lab Results  Component Value Date   CHOL 142 05/24/2021   HDL 48 05/24/2021   LDLCALC 82 05/24/2021   TRIG 59 05/24/2021   CHOLHDL 2.7 04/22/2013   Lab Results  Component Value Date   VD25OH 20.0 (L) 05/24/2021   Lab Results  Component Value Date   WBC 11.7 (H) 05/24/2021   HGB 10.4 (L) 05/24/2021   HCT 33.1 (L) 05/24/2021   MCV 70 (L) 05/24/2021   PLT 414 05/24/2021   Lab Results  Component Value Date   IRON 21 (L) 05/24/2021   TIBC 347 05/24/2021   FERRITIN 45 05/24/2021    Attestation Statements:   Reviewed by clinician on day of visit: allergies, medications, problem list, medical history, surgical history, family history, social history, and previous encounter notes.  Coral Ceo, CMA, am acting as transcriptionist for Coralie Common, MD.   I have reviewed the above documentation  for accuracy and completeness, and I agree with the above. - Coralie Common, MD

## 2021-07-05 ENCOUNTER — Other Ambulatory Visit (INDEPENDENT_AMBULATORY_CARE_PROVIDER_SITE_OTHER): Payer: Self-pay

## 2021-07-05 ENCOUNTER — Ambulatory Visit (INDEPENDENT_AMBULATORY_CARE_PROVIDER_SITE_OTHER): Payer: Medicare Other | Admitting: Family Medicine

## 2021-07-05 ENCOUNTER — Other Ambulatory Visit: Payer: Self-pay

## 2021-07-05 ENCOUNTER — Encounter (INDEPENDENT_AMBULATORY_CARE_PROVIDER_SITE_OTHER): Payer: Self-pay | Admitting: Family Medicine

## 2021-07-05 VITALS — BP 121/77 | HR 89 | Temp 98.2°F | Ht 63.0 in | Wt 282.0 lb

## 2021-07-05 DIAGNOSIS — R7303 Prediabetes: Secondary | ICD-10-CM

## 2021-07-05 DIAGNOSIS — Z6841 Body Mass Index (BMI) 40.0 and over, adult: Secondary | ICD-10-CM | POA: Diagnosis not present

## 2021-07-05 DIAGNOSIS — E559 Vitamin D deficiency, unspecified: Secondary | ICD-10-CM | POA: Diagnosis not present

## 2021-07-05 MED ORDER — VITAMIN D (ERGOCALCIFEROL) 1.25 MG (50000 UNIT) PO CAPS: 50000.0000 [IU] | ORAL_CAPSULE | ORAL | 0 refills | Status: DC

## 2021-07-05 MED ORDER — METFORMIN HCL 500 MG PO TABS: 500.0000 mg | ORAL_TABLET | Freq: Every day | ORAL | 0 refills | Status: DC

## 2021-07-05 NOTE — Progress Notes (Signed)
Chief Complaint:   OBESITY Misty Moses is here to discuss her progress with her obesity treatment plan along with Moses of her obesity related diagnoses. Misty Moses is on the Category 4 Plan and states she is following her eating plan approximately 75% of the time. Misty Moses states she is doing 4,000 steps 7 times per week.  Today's visit was #: 3 Starting weight: 291 lbs Starting date: 05/24/2021 Today's weight: 282 lbs Today's date: 07/05/2021 Total lbs lost to date: 9 Total lbs lost since last in-office visit: 13  Interim History: Misty Moses has been eating less recently and hasn't eaten many microwave meals. She has eaten more home cooked meals/foods. She has been trying to increase her water intake as well. Pt is skipping breakfast. Lunch is chicken, greens beans and dinner is salad, pork chops, tomatoes. She occasionally incorporates some indulgent food. 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 Pt is going to her grandma's for Christmas.  Subjective:   1. Prediabetes Misty Moses is on Metformin and reports occasional GI side effects. Her last A1c was 5.7 with an insulin level of 33.8.  2. Vitamin D deficiency Pt denies nausea, vomiting, and muscle weakness but notes fatigue. Her Vit D level is 20.  Assessment/Plan:   1. Prediabetes Misty Moses will continue to work on weight loss, exercise, and decreasing simple carbohydrates to help decrease the risk of diabetes. Continue Metformin 500 mg.  Refill- metFORMIN (GLUCOPHAGE) 500 MG tablet; Take 1 tablet (500 mg total) by mouth daily with breakfast.  Dispense: 30 tablet; Refill: 0  2. Vitamin D deficiency Low Vitamin D level contributes to fatigue and are associated with obesity, breast, and colon cancer. She agrees to continue to take prescription Vitamin D 50,000 IU every week and will Moses for routine testing of Vitamin D, at least 2-3 times per year to avoid over-replacement.  Refill- Vitamin D, Ergocalciferol,  (DRISDOL) 1.25 MG (50000 UNIT) CAPS capsule; Take 1 capsule (50,000 Units total) by mouth every 7 (seven) days.  Dispense: 4 capsule; Refill: 0  3. Obesity with current BMI of 50.1  Misty Moses is currently in the action stage of change. As such, her goal is to continue with weight loss efforts. She has agreed to the Category 4 Plan.   Exercise goals: All adults should avoid inactivity. Some physical activity is better than none, and adults who participate in any amount of physical activity gain some health benefits.  Behavioral modification strategies: increasing lean protein intake, no skipping meals, meal planning and cooking strategies, and planning for success.  Misty Moses in 3 Moses. She was informed of the importance of frequent Moses visits to maximize her success with intensive lifestyle modifications for her multiple health conditions.   Objective:   Blood pressure 121/77, pulse 89, temperature 98.2 F (36.8 C), height 5\' 3"  (1.6 m), weight 282 lb (127.9 kg), last menstrual period 06/12/2021, SpO2 98 %. Body mass index is 49.95 kg/m.  General: Cooperative, alert, well developed, in no acute distress. HEENT: Conjunctivae and lids unremarkable. Cardiovascular: Regular rhythm.  Lungs: Normal work of breathing. Neurologic: No focal deficits.   Lab Results  Component Value Date   CREATININE 0.93 05/24/2021   BUN 26 (H) 05/24/2021   NA 139 05/24/2021   K 4.2 05/24/2021   CL 105 05/24/2021   CO2 16 (L) 05/24/2021   Lab Results  Component Value Date   ALT 12 05/24/2021   AST 14 05/24/2021  ALKPHOS 98 05/24/2021   BILITOT <0.2 05/24/2021   Lab Results  Component Value Date   HGBA1C 5.7 (H) 05/24/2021   HGBA1C 5.5 04/22/2013   Lab Results  Component Value Date   INSULIN 33.8 (H) 05/24/2021   Lab Results  Component Value Date   TSH 0.899 05/24/2021   Lab Results  Component Value Date   CHOL 142 05/24/2021   HDL 48 05/24/2021    LDLCALC 82 05/24/2021   TRIG 59 05/24/2021   CHOLHDL 2.7 04/22/2013   Lab Results  Component Value Date   VD25OH 20.0 (L) 05/24/2021   Lab Results  Component Value Date   WBC 11.7 (H) 05/24/2021   HGB 10.4 (L) 05/24/2021   HCT 33.1 (L) 05/24/2021   MCV 70 (L) 05/24/2021   PLT 414 05/24/2021   Lab Results  Component Value Date   IRON 21 (L) 05/24/2021   TIBC 347 05/24/2021   FERRITIN 45 05/24/2021    Obesity Behavioral Intervention:   Approximately 15 minutes were spent on the discussion below.  ASK: We discussed the diagnosis of obesity with Misty Moses today and Misty Moses agreed to give [place] permission to discuss obesity behavioral modification therapy today.  ASSESS: Misty Moses has the diagnosis of obesity and her BMI today is 50.1. Misty Moses is in the action stage of change.   ADVISE: Misty Moses was educated on the multiple health risks of obesity as well as the benefit of weight loss to improve her health. She was advised of the need for long term treatment and the importance of lifestyle modifications to improve her current health and to decrease her risk of future health problems.  AGREE: Multiple dietary modification options and treatment options were discussed and Misty Moses agreed to follow the recommendations documented in the above note.  ARRANGE: Misty Moses was educated on the importance of frequent visits to treat obesity as outlined per CMS and USPSTF guidelines and agreed to schedule her next follow up appointment today.  Attestation Statements:   Reviewed by clinician on day of visit: allergies, medications, problem list, medical history, surgical history, family history, social history, and previous encounter notes.  Edmund Hilda, CMA, am acting as transcriptionist for Reuben Likes, MD.   I have reviewed the above documentation for accuracy and completeness, and I agree with the above. - Reuben Likes, MD

## 2021-08-02 ENCOUNTER — Telehealth (INDEPENDENT_AMBULATORY_CARE_PROVIDER_SITE_OTHER): Payer: Medicare Other | Admitting: Family Medicine

## 2021-08-02 ENCOUNTER — Encounter (INDEPENDENT_AMBULATORY_CARE_PROVIDER_SITE_OTHER): Payer: Self-pay | Admitting: Family Medicine

## 2021-08-02 DIAGNOSIS — Z6841 Body Mass Index (BMI) 40.0 and over, adult: Secondary | ICD-10-CM

## 2021-08-02 DIAGNOSIS — R7303 Prediabetes: Secondary | ICD-10-CM

## 2021-08-02 DIAGNOSIS — E559 Vitamin D deficiency, unspecified: Secondary | ICD-10-CM | POA: Diagnosis not present

## 2021-08-02 MED ORDER — VITAMIN D (ERGOCALCIFEROL) 1.25 MG (50000 UNIT) PO CAPS: 50000.0000 [IU] | ORAL_CAPSULE | ORAL | 0 refills | Status: DC

## 2021-08-02 MED ORDER — METFORMIN HCL 500 MG PO TABS: 500.0000 mg | ORAL_TABLET | Freq: Every day | ORAL | 0 refills | Status: DC

## 2021-08-06 NOTE — Progress Notes (Signed)
TeleHealth Visit:  Due to the COVID-19 pandemic, this visit was completed with telemedicine (audio/video) technology to reduce patient and provider exposure as well as to preserve personal protective equipment.   Misty Moses has verbally consented to this TeleHealth visit. The patient is located at home, the provider is located at the [facility] office. The participants in this visit include the listed provider and patient. The visit was conducted today via video.   Chief Complaint: OBESITY Misty Moses is here to discuss her progress with her obesity treatment plan along with follow-up of her obesity related diagnoses. Misty Moses is on the Category 4 Plan and states she is following her eating plan approximately 0% of the time. Misty Moses states she is walking and kettlebell 10-20 minutes 2-3 times per week.  Today's visit was #: 4 Starting weight: 291 lbs Starting date: 05/24/2021  Interim History: Pt is having a flare of allergies recently. She is trying to focus on meat, veggies, and few carbs. Her weight today was 291 (last weight 282). She has been picking up some weights and walking more. She has been hungry recently. Pt is eating more bacon, sausage, and toast for breakfast. Lunch is chicken or pork chops with salad and apples. Supper is sometimes leftovers and some sweets like a slice of cake.  Subjective:   1. Vitamin D deficiency Pt denies nausea, vomiting, and muscle weakness but notes fatigue. Her last Vit D level was 20.0.  2. Prediabetes Pt's last A1c was 5.7 on 05/24/21. She is on metformin with no GI side effects.  Assessment/Plan:   1. Vitamin D deficiency Low Vitamin D level contributes to fatigue and are associated with obesity, breast, and colon cancer. She agrees to continue to take prescription Vitamin D 50,000 IU every week and will follow-up for routine testing of Vitamin D, at least 2-3 times per year to avoid over-replacement.  Refill- Vitamin D,  Ergocalciferol, (DRISDOL) 1.25 MG (50000 UNIT) CAPS capsule; Take 1 capsule (50,000 Units total) by mouth every 7 (seven) days.  Dispense: 4 capsule; Refill: 0  2. Prediabetes Misty Moses will continue to work on weight loss, exercise, and decreasing simple carbohydrates to help decrease the risk of diabetes. We may need to increase metformin if carb intake continues to be elevated.  Refill- metFORMIN (GLUCOPHAGE) 500 MG tablet; Take 1 tablet (500 mg total) by mouth daily with breakfast.  Dispense: 30 tablet; Refill: 0  3. Obesity with current BMI of 50.1  Misty Moses is currently in the action stage of change. As such, her goal is to continue with weight loss efforts. She has agreed to the Category 4 Plan- change bacon or sausage to [place] and measure protein at lunch and dinner.   Exercise goals: All adults should avoid inactivity. Some physical activity is better than none, and adults who participate in any amount of physical activity gain some health benefits.  Behavioral modification strategies: increasing lean protein intake, meal planning and cooking strategies, keeping healthy foods in the home, and planning for success.  Misty Moses has agreed to follow-up with our clinic in 2 weeks. She was informed of the importance of frequent follow-up visits to maximize her success with intensive lifestyle modifications for her multiple health conditions.  Objective:   VITALS: Per patient if applicable, see vitals. GENERAL: Alert and in no acute distress. CARDIOPULMONARY: No increased WOB. Speaking in clear sentences.  PSYCH: Pleasant and cooperative. Speech normal rate and rhythm. Affect is appropriate. Insight and judgement are appropriate. Attention is focused,  linear, and appropriate.  NEURO: Oriented as arrived to appointment on time with no prompting.   Lab Results  Component Value Date   CREATININE 0.93 05/24/2021   BUN 26 (H) 05/24/2021   NA 139 05/24/2021   K 4.2 05/24/2021   CL 105 05/24/2021    CO2 16 (L) 05/24/2021   Lab Results  Component Value Date   ALT 12 05/24/2021   AST 14 05/24/2021   ALKPHOS 98 05/24/2021   BILITOT <0.2 05/24/2021   Lab Results  Component Value Date   HGBA1C 5.7 (H) 05/24/2021   HGBA1C 5.5 04/22/2013   Lab Results  Component Value Date   INSULIN 33.8 (H) 05/24/2021   Lab Results  Component Value Date   TSH 0.899 05/24/2021   Lab Results  Component Value Date   CHOL 142 05/24/2021   HDL 48 05/24/2021   LDLCALC 82 05/24/2021   TRIG 59 05/24/2021   CHOLHDL 2.7 04/22/2013   Lab Results  Component Value Date   VD25OH 20.0 (L) 05/24/2021   Lab Results  Component Value Date   WBC 11.7 (H) 05/24/2021   HGB 10.4 (L) 05/24/2021   HCT 33.1 (L) 05/24/2021   MCV 70 (L) 05/24/2021   PLT 414 05/24/2021   Lab Results  Component Value Date   IRON 21 (L) 05/24/2021   TIBC 347 05/24/2021   FERRITIN 45 05/24/2021    Attestation Statements:   Reviewed by clinician on day of visit: allergies, medications, problem list, medical history, surgical history, family history, social history, and previous encounter notes.  Edmund Hilda, CMA, am acting as transcriptionist for Reuben Likes, MD.   I have reviewed the above documentation for accuracy and completeness, and I agree with the above. - Reuben Likes, MD

## 2021-08-15 ENCOUNTER — Encounter (INDEPENDENT_AMBULATORY_CARE_PROVIDER_SITE_OTHER): Payer: Self-pay

## 2021-08-20 ENCOUNTER — Ambulatory Visit (INDEPENDENT_AMBULATORY_CARE_PROVIDER_SITE_OTHER): Payer: Medicare Other | Admitting: Physician Assistant

## 2021-08-29 ENCOUNTER — Other Ambulatory Visit: Payer: Self-pay

## 2021-08-29 ENCOUNTER — Encounter (INDEPENDENT_AMBULATORY_CARE_PROVIDER_SITE_OTHER): Payer: Self-pay | Admitting: Bariatrics

## 2021-08-29 ENCOUNTER — Ambulatory Visit (INDEPENDENT_AMBULATORY_CARE_PROVIDER_SITE_OTHER): Payer: Medicare Other | Admitting: Bariatrics

## 2021-08-29 VITALS — BP 131/84 | HR 86 | Temp 97.8°F | Ht 65.0 in | Wt 292.0 lb

## 2021-08-29 DIAGNOSIS — Z6841 Body Mass Index (BMI) 40.0 and over, adult: Secondary | ICD-10-CM

## 2021-08-29 DIAGNOSIS — M25561 Pain in right knee: Secondary | ICD-10-CM

## 2021-08-29 DIAGNOSIS — R7303 Prediabetes: Secondary | ICD-10-CM

## 2021-08-29 DIAGNOSIS — E559 Vitamin D deficiency, unspecified: Secondary | ICD-10-CM

## 2021-08-29 DIAGNOSIS — E669 Obesity, unspecified: Secondary | ICD-10-CM | POA: Diagnosis not present

## 2021-08-29 MED ORDER — MELOXICAM 15 MG PO TABS: 15.0000 mg | ORAL_TABLET | Freq: Every day | ORAL | 0 refills | Status: DC

## 2021-08-29 MED ORDER — METFORMIN HCL 500 MG PO TABS: 500.0000 mg | ORAL_TABLET | Freq: Two times a day (BID) | ORAL | 0 refills | Status: DC

## 2021-08-29 MED ORDER — VITAMIN D (ERGOCALCIFEROL) 1.25 MG (50000 UNIT) PO CAPS: 50000.0000 [IU] | ORAL_CAPSULE | ORAL | 0 refills | Status: DC

## 2021-08-29 NOTE — Progress Notes (Signed)
Chief Complaint:   OBESITY Misty Moses is here to discuss her progress with her obesity treatment plan along with follow-up of her obesity related diagnoses. Misty Moses is on the Category 4 Plan and states she is following her eating plan approximately 0% of the time. Misty Moses states she is doing 0 minutes 0 times per week.  Today's visit was #: 5 Starting weight: 291 lbs Starting date: 05/24/2022 Today's weight: 292 lbs Today's date: 08/29/2021 Total lbs lost to date: 0 Total lbs lost since last in-office visit: 0  Interim History: Misty Moses is up 10 lbs since her last visit. She has not done well with the plan. She is eating "junk food".   Subjective:   1. Pre-diabetes Misty Moses is taking Gluophage. Her appetite is high. Her diabetes is worsening.  2. Vitamin D deficiency Misty Moses is taking Vitamin D as directed.   3. Right knee pain, unspecified chronicity Misty Moses is taking Mobic. 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   Assessment/Plan:   1. Pre-diabetes We will refill Glucophage 500 mg for 1 month with no refills.Harleyquinn will continue to work on weight loss, exercise, and decreasing simple carbohydrates to help decrease the risk of diabetes.   - metFORMIN (GLUCOPHAGE) 500 MG tablet; Take 1 tablet (500 mg total) by mouth 2 (two) times daily with a meal.  Dispense: 30 tablet; Refill: 0  2. Vitamin D deficiency Low Vitamin D level contributes to fatigue and are associated with obesity, breast, and colon cancer. We will refill prescription Vitamin D 50,000 IU every week for 1 month with no refills and Izabellah will follow-up for routine testing of Vitamin D, at least 2-3 times per year to avoid over-replacement.  - Vitamin D, Ergocalciferol, (DRISDOL) 1.25 MG (50000 UNIT) CAPS capsule; Take 1 capsule (50,000 Units total) by mouth every 7 (seven) days.  Dispense: 4 capsule; Refill: 0  3. Right knee pain, unspecified chronicity We will refill Mobic 15 mg for 1 month with no  refills.  - meloxicam (MOBIC) 15 MG tablet; Take 1 tablet (15 mg total) by mouth daily.  Dispense: 30 tablet; Refill: 0  4. Obesity with current BMI of 48.6 Misty Moses is currently in the action stage of change. As such, her goal is to continue with weight loss efforts. She has agreed to the Category 4 Plan.   Misty Moses will continue meal planning. She will get back on plan. She will be mindful eating. She will eat protein with each meal. We discussed eating out. Handouts: "Eating Out" and "On the Road" was provided today.  Exercise goals: No exercise has been prescribed at this time.  Behavioral modification strategies: increasing lean protein intake, decreasing simple carbohydrates, increasing vegetables, increasing water intake, decreasing eating out, no skipping meals, meal planning and cooking strategies, keeping healthy foods in the home, and planning for success.  Misty Moses has agreed to follow-up with our clinic in 2-3 weeks with Dr. Lawson Radar. She was informed of the importance of frequent follow-up visits to maximize her success with intensive lifestyle modifications for her multiple health conditions.   Objective:   Blood pressure 131/84, pulse 86, temperature 97.8 F (36.6 C), height 5\' 5"  (1.651 m), weight 292 lb (132.5 kg), SpO2 98 %. Body mass index is 48.59 kg/m.  General: Cooperative, alert, well developed, in no acute distress. HEENT: Conjunctivae and lids unremarkable. Cardiovascular: Regular rhythm.  Lungs: Normal work of breathing. Neurologic: No focal deficits.   Lab Results  Component Value Date   CREATININE  0.93 05/24/2021   BUN 26 (H) 05/24/2021   NA 139 05/24/2021   K 4.2 05/24/2021   CL 105 05/24/2021   CO2 16 (L) 05/24/2021   Lab Results  Component Value Date   ALT 12 05/24/2021   AST 14 05/24/2021   ALKPHOS 98 05/24/2021   BILITOT <0.2 05/24/2021   Lab Results  Component Value Date   HGBA1C 5.7 (H) 05/24/2021   HGBA1C 5.5 04/22/2013   Lab Results   Component Value Date   INSULIN 33.8 (H) 05/24/2021   Lab Results  Component Value Date   TSH 0.899 05/24/2021   Lab Results  Component Value Date   CHOL 142 05/24/2021   HDL 48 05/24/2021   LDLCALC 82 05/24/2021   TRIG 59 05/24/2021   CHOLHDL 2.7 04/22/2013   Lab Results  Component Value Date   VD25OH 20.0 (L) 05/24/2021   Lab Results  Component Value Date   WBC 11.7 (H) 05/24/2021   HGB 10.4 (L) 05/24/2021   HCT 33.1 (L) 05/24/2021   MCV 70 (L) 05/24/2021   PLT 414 05/24/2021   Lab Results  Component Value Date   IRON 21 (L) 05/24/2021   TIBC 347 05/24/2021   FERRITIN 45 05/24/2021   Attestation Statements:   Reviewed by clinician on day of visit: allergies, medications, problem list, medical history, surgical history, family history, social history, and previous encounter notes.  I, Jackson Latino, RMA, am acting as Energy manager for [employer], DO.  I have reviewed the above documentation for accuracy and completeness, and I agree with the above. Corinna Capra, DO

## 2021-08-30 ENCOUNTER — Encounter (INDEPENDENT_AMBULATORY_CARE_PROVIDER_SITE_OTHER): Payer: Self-pay | Admitting: Bariatrics

## 2021-09-06 ENCOUNTER — Emergency Department (HOSPITAL_COMMUNITY)
Admission: EM | Admit: 2021-09-06 | Discharge: 2021-09-06 | Disposition: A | Discharge status: Home / Self Care | Payer: Medicare Other | Attending: Emergency Medicine | Admitting: Emergency Medicine

## 2021-09-06 ENCOUNTER — Encounter (HOSPITAL_COMMUNITY): Payer: Self-pay | Admitting: Emergency Medicine

## 2021-09-06 DIAGNOSIS — R040 Epistaxis: Secondary | ICD-10-CM | POA: Diagnosis not present

## 2021-09-06 MED ORDER — OXYMETAZOLINE HCL 0.05 % NA SOLN
1.0000 | Freq: Once | NASAL | Status: DC
  Filled 2021-09-06 (×2): qty 30

## 2021-09-06 NOTE — ED Provider Notes (Signed)
[facility] EMERGENCY DEPARTMENT Provider Note   CSN: [id number] Arrival date & time: 09/06/21  1841     History  No chief complaint on file.   Misty Moses is a 21 y.o. female.  21 year old female presents for nosebleed via EMS.  See triage screening, nosebleed x3 days, otherwise healthy female.      Home Medications Prior to Admission medications   Medication Sig Start Date End Date Taking? Authorizing Provider  escitalopram (LEXAPRO) 10 MG tablet Take 1 tablet (10 mg total) by mouth daily. 12/10/17   Mordecai Maes, NP  meloxicam (MOBIC) 15 MG tablet Take 1 tablet (15 mg total) by mouth daily. 08/29/21   Jearld Lesch A, DO  metFORMIN (GLUCOPHAGE) 500 MG tablet Take 1 tablet (500 mg total) by mouth 2 (two) times daily with a meal. 08/29/21   Jearld Lesch A, DO  Oxcarbazepine (TRILEPTAL) 300 MG tablet Take 1 tablet (300 mg total) by mouth 2 (two) times daily. 12/09/17   Mordecai Maes, NP  topiramate (TOPAMAX) 200 MG tablet Take 200 mg by mouth 2 (two) times daily.    [provider]  traZODone (DESYREL) 150 MG tablet Take 1 tablet (150 mg total) by mouth at bedtime as needed for sleep. 04/12/21   White, Patrice L, NP  Vitamin D, Ergocalciferol, (DRISDOL) 1.25 MG (50000 UNIT) CAPS capsule Take 1 capsule (50,000 Units total) by mouth every 7 (seven) days. 08/29/21   Georgia Lopes, DO      Allergies    Hydrocodone    Review of Systems   Review of Systems Negative except as per HPI Physical Exam Updated Vital Signs BP (!) 128/91 (BP Location: Right Arm)    Pulse 92    Temp 98 F (36.7 C) (Oral)    Resp 16    SpO2 100%  Physical Exam Vitals and nursing note reviewed.  Constitutional:      General: She is not in acute distress.    Appearance: She is well-developed. She is not diaphoretic.  HENT:     Head: Normocephalic and atraumatic.     Nose:     Left Nostril: Epistaxis present.     Comments: Mild oozing from left nare    Mouth/Throat:      Mouth: Mucous membranes are moist.  Eyes:     Conjunctiva/sclera: Conjunctivae normal.  Pulmonary:     Effort: Pulmonary effort is normal.  Musculoskeletal:     Cervical back: Neck supple.  Skin:    General: Skin is warm and dry.     Findings: No erythema or rash.  Neurological:     Mental Status: She is alert and oriented to person, place, and time.  Psychiatric:        Behavior: Behavior normal.    ED Results / Procedures / Treatments   Labs (all labs ordered are listed, but only abnormal results are displayed) Labs Reviewed - No data to display  EKG None  Radiology No results found.  Procedures .Epistaxis Management  Date/Time: 09/06/2021 9:23 PM Performed by: Tacy Learn, PA-C Authorized by: Tacy Learn, PA-C   Consent:    Consent obtained:  Verbal   Consent given by:  Patient   Risks, benefits, and alternatives were discussed: yes     Risks discussed:  Bleeding, nasal injury, pain and infection   Alternatives discussed:  No treatment Universal protocol:    Patient identity confirmed:  Verbally with patient Anesthesia:    Anesthesia method:  None  Procedure details:    Treatment site:  L anterior   Treatment complexity:  Limited   Treatment episode: recurring   Post-procedure details:    Assessment:  Bleeding decreased   Procedure completion:  Tolerated well, no immediate complications    Medications Ordered in ED Medications  oxymetazoline (AFRIN) 0.05 % nasal spray 1 spray (has no administration in time range)    ED Course/ Medical Decision Making/ A&P                           Medical Decision Making Risk OTC drugs.   20 year old female with left side nosebleed intermittent x3 days, onset again today after blowing her nose/sneezing.  On arrival, slight oozing from the left side.  Had tried Afrin at home without control.  Patient was instructed to blow her nose to remove all the clots, Afrin was sprayed on both sides and pressure was held  for 10 minutes.  On reassessment, had slight oozing persisting, additional Afrin was applied and patient was observed.  After 45-minute waiting period, no further bleeding.  Patient would like to be discharged at this time with her Afrin with instructions on home management and referral to ENT.  Will return to ED if unable to control or manage bleeding at home.        Final Clinical Impression(s) / ED Diagnoses Final diagnoses:  Epistaxis    Rx / DC Orders ED Discharge Orders     None         Roque Lias 09/06/21 2125    Lucrezia Starch, MD 09/06/21 2303

## 2021-09-06 NOTE — ED Provider Triage Note (Signed)
Emergency Medicine Provider Triage Evaluation Note  Misty Moses , a 21 y.o. female  was evaluated in triage.  Pt complains of nosebleed x3 days, no improvement with pressure and Afrin.  Review of Systems  Positive: Nosebleed Negative: Injury, clotting disorder  Physical Exam  BP 132/83 (BP Location: Left Arm)    Pulse 89    Temp 98 F (36.7 C) (Oral)    Resp 16    SpO2 97%  Gen:   Awake, no distress   Resp:  Normal effort  MSK:   Moves extremities without difficulty  Other:  Active bleed from left side  Medical Decision Making  Medically screening exam initiated at 7:04 PM.  Appropriate orders placed.  [name] was informed that the remainder of the evaluation will be completed by another provider, this initial triage assessment does not replace that evaluation, and the importance of remaining in the ED until their evaluation is complete.  Pressure applied, continues to bleed, order for Afrin placed.   Jeannie Fend, PA-C 09/06/21 [phone]

## 2021-09-06 NOTE — Discharge Instructions (Signed)
If bleeding returns- Blow your nose to remove any clots. Spray both sides with Afrin. Pinch the soft part/end of your nose closed and hold shut for 10-15 minutes straight (do not let go to check). If bleeding continues, consider return to ER for recheck and further treatment.  Call ENT in the morning to schedule follow up.

## 2021-09-06 NOTE — ED Triage Notes (Signed)
Pt here from home via ems with c/o nose bleed off and on for the last 3 days , started today about 2 hours ago and hasn't stopped

## 2021-09-17 ENCOUNTER — Ambulatory Visit (INDEPENDENT_AMBULATORY_CARE_PROVIDER_SITE_OTHER): Payer: Medicare Other | Admitting: Family Medicine

## 2021-09-23 ENCOUNTER — Other Ambulatory Visit (INDEPENDENT_AMBULATORY_CARE_PROVIDER_SITE_OTHER): Payer: Self-pay | Admitting: Bariatrics

## 2021-09-23 DIAGNOSIS — M25561 Pain in right knee: Secondary | ICD-10-CM

## 2021-09-24 ENCOUNTER — Other Ambulatory Visit (INDEPENDENT_AMBULATORY_CARE_PROVIDER_SITE_OTHER): Payer: Self-pay | Admitting: Bariatrics

## 2021-09-24 DIAGNOSIS — M25561 Pain in right knee: Secondary | ICD-10-CM

## 2021-09-24 NOTE — Telephone Encounter (Signed)
Last OV with Dr Brown 

## 2021-09-25 NOTE — Telephone Encounter (Signed)
Dr.Brown 

## 2021-10-03 ENCOUNTER — Other Ambulatory Visit (INDEPENDENT_AMBULATORY_CARE_PROVIDER_SITE_OTHER): Payer: Self-pay | Admitting: Bariatrics

## 2021-10-03 DIAGNOSIS — E559 Vitamin D deficiency, unspecified: Secondary | ICD-10-CM

## 2021-10-03 DIAGNOSIS — M25561 Pain in right knee: Secondary | ICD-10-CM

## 2021-10-03 DIAGNOSIS — R7303 Prediabetes: Secondary | ICD-10-CM

## 2021-10-03 MED ORDER — METFORMIN HCL 500 MG PO TABS: 500.0000 mg | ORAL_TABLET | Freq: Two times a day (BID) | ORAL | 0 refills | Status: DC

## 2021-10-03 MED ORDER — VITAMIN D (ERGOCALCIFEROL) 1.25 MG (50000 UNIT) PO CAPS: 50000.0000 [IU] | ORAL_CAPSULE | ORAL | 0 refills | Status: DC

## 2021-10-03 MED ORDER — MELOXICAM 15 MG PO TABS: 15.0000 mg | ORAL_TABLET | Freq: Every day | ORAL | 0 refills | Status: DC

## 2021-10-03 NOTE — Telephone Encounter (Signed)
LAST APPOINTMENT DATE: 08/29/21 ?NEXT APPOINTMENT DATE: 10/22/21 ? ? ?[facility] [address] ?[place] ?[place] [address] ?Phone: [phone] Fax: [phone] ? ?Patient is requesting a refill of the following medications: ?Pending Prescriptions:                       Disp   Refills ?  metFORMIN (GLUCOPHAGE) 500 MG tablet       30 tab*0       ?Sig: Take 1 tablet (500 mg total) by mouth 2 (two) times ?         daily with a meal. ?  Vitamin D, Ergocalciferol, (DRISDOL) 1.25 *4 caps*0       ?Sig: Take 1 capsule (50,000 Units total) by mouth every 7 ?         (seven) days. ?  meloxicam (MOBIC) 15 MG tablet             30 tab*0       ?Sig: Take 1 tablet (15 mg total) by mouth daily. ? ? ?Date last filled: 08/29/21 ?Previously prescribed by Dr. Owens Shark ? ?Lab Results ?     Component                Value               Date                 ?     HGBA1C                   5.7 (H)             05/24/2021           ?     HGBA1C                   5.5                 04/22/2013           ?Lab Results ?     Component                Value               Date                 ?     [place]                  82                  05/24/2021           ?     CREATININE               0.93                05/24/2021           ?Lab Results ?     Component                Value               Date                 ?     VD25OH                   20.0 (L)  05/24/2021           ? ?BP Readings from Last 3 Encounters: ?09/06/21 : (!) 128/91 ?08/29/21 : 131/84 ?07/05/21 : 121/77 ?

## 2021-10-03 NOTE — Telephone Encounter (Signed)
Last OV with Dr Brown 

## 2021-10-22 ENCOUNTER — Telehealth (INDEPENDENT_AMBULATORY_CARE_PROVIDER_SITE_OTHER): Payer: Medicare Other | Admitting: Bariatrics

## 2021-10-22 ENCOUNTER — Encounter (INDEPENDENT_AMBULATORY_CARE_PROVIDER_SITE_OTHER): Payer: Self-pay | Admitting: Bariatrics

## 2021-10-22 DIAGNOSIS — E559 Vitamin D deficiency, unspecified: Secondary | ICD-10-CM

## 2021-10-22 DIAGNOSIS — R7303 Prediabetes: Secondary | ICD-10-CM

## 2021-10-22 DIAGNOSIS — M25561 Pain in right knee: Secondary | ICD-10-CM

## 2021-10-22 DIAGNOSIS — Z6841 Body Mass Index (BMI) 40.0 and over, adult: Secondary | ICD-10-CM

## 2021-10-22 DIAGNOSIS — E669 Obesity, unspecified: Secondary | ICD-10-CM | POA: Diagnosis not present

## 2021-10-22 MED ORDER — METFORMIN HCL 500 MG PO TABS: 500.0000 mg | ORAL_TABLET | Freq: Two times a day (BID) | ORAL | 0 refills | Status: DC

## 2021-10-22 MED ORDER — VITAMIN D (ERGOCALCIFEROL) 1.25 MG (50000 UNIT) PO CAPS: 50000.0000 [IU] | ORAL_CAPSULE | ORAL | 0 refills | Status: DC

## 2021-10-22 MED ORDER — MELOXICAM 15 MG PO TABS: 15.0000 mg | ORAL_TABLET | Freq: Every day | ORAL | 0 refills | Status: DC

## 2021-10-23 ENCOUNTER — Encounter (INDEPENDENT_AMBULATORY_CARE_PROVIDER_SITE_OTHER): Payer: Self-pay | Admitting: Bariatrics

## 2021-10-23 NOTE — Progress Notes (Signed)
? ? ?TeleHealth Visit:  ?Due to the COVID-19 pandemic, this visit was completed with telemedicine (audio/video) technology to reduce patient and provider exposure as well as to preserve personal protective equipment.  ? ?Misty Moses has verbally consented to this TeleHealth visit. The patient is located at home, the provider is located at the [facility] office. The participants in this visit include the listed provider and patient. The visit was conducted today via video. ? ?Chief Complaint: OBESITY ?Misty Moses is here to discuss her progress with her obesity treatment plan along with follow-up of her obesity related diagnoses. Misty Moses is on the Category 4 Plan and states she is following her eating plan approximately 0% of the time. Misty Moses states she is doing 0 minutes 0 times per week. ? ?Today's visit was #: 6 ?Starting weight: 291 lbs ?Starting date: 05/24/2022 ? ?Interim History: Misty Moses states that she is up 2 lbs since her last visit. She states that she is sick and needs a video visit.  ? ?Subjective:  ? ?1. Pre-diabetes ?Misty Moses is currently taking Metformin.  ? ?2. Vitamin D deficiency ?Misty Moses is taking Vitamin D as directed.  ? ?3. Right knee pain, unspecified chronicity ?Misty Moses is taking Mobic as needed. ? ?Assessment/Plan:  ? ?1. Pre-diabetes ?We will refill Metformin 500 mg for 1 month with no refills. She will have no sodas. Misty Moses will continue to work on weight loss, exercise, and decreasing simple carbohydrates to help decrease the risk of diabetes.  ? ?- metFORMIN (GLUCOPHAGE) 500 MG tablet; Take 1 tablet (500 mg total) by mouth 2 (two) times daily with a meal.  Dispense: 60 tablet; Refill: 0 ? ?2. Vitamin D deficiency ?Low Vitamin D level contributes to fatigue and are associated with obesity, breast, and colon cancer. We will refill prescription Vitamin D 50,000 IU every week for 1 month with no refills and Misty Moses will follow-up for routine testing of Vitamin D, at least 2-3 times per year  to avoid over-replacement. ? ?- Vitamin D, Ergocalciferol, (DRISDOL) 1.25 MG (50000 UNIT) CAPS capsule; Take 1 capsule (50,000 Units total) by mouth every 7 (seven) days.  Dispense: 4 capsule; Refill: 0 ? ?3. Right knee pain, unspecified chronicity ?We will refill Mobic 15 mg for 1 month with no refills.  ? ?- meloxicam (MOBIC) 15 MG tablet; Take 1 tablet (15 mg total) by mouth daily.  Dispense: 30 tablet; Refill: 0 ? ?4. Obesity with current BMI of 48.6 ?Misty Moses is currently in the action stage of change. As such, her goal is to continue with weight loss efforts. She has agreed to the Category 4 Plan and practicing portion control and making smarter food choices, such as increasing vegetables and decreasing simple carbohydrates.  ? ?Misty Moses will continue meal planning and she will continue intentional eating. She will decrease sodas and she will increase water. She will minimize sweets and starches.  ? ?Exercise goals: No exercise has been prescribed at this time. ? ?Behavioral modification strategies: increasing lean protein intake, decreasing simple carbohydrates, increasing vegetables, increasing water intake, decreasing eating out, no skipping meals, meal planning and cooking strategies, keeping healthy foods in the home, and planning for success. ? ?Misty Moses has agreed to follow-up with our clinic in 2-3 weeks with Dr Jearld Shines or Everardo Pacific, [place] (fasting). She was informed of the importance of frequent follow-up visits to maximize her success with intensive lifestyle modifications for her multiple health conditions. ? ?Objective:  ? ?VITALS: Per patient if applicable, see vitals. ?GENERAL: Alert and in no  acute distress. ?CARDIOPULMONARY: No increased WOB. Speaking in clear sentences.  ?PSYCH: Pleasant and cooperative. Speech normal rate and rhythm. Affect is appropriate. Insight and judgement are appropriate. Attention is focused, linear, and appropriate.  ?NEURO: Oriented as arrived to appointment on  time with no prompting.  ? ?Lab Results  ?Component Value Date  ? CREATININE 0.93 05/24/2021  ? BUN 26 (H) 05/24/2021  ? NA 139 05/24/2021  ? K 4.2 05/24/2021  ? CL 105 05/24/2021  ? CO2 16 (L) 05/24/2021  ? ?Lab Results  ?Component Value Date  ? ALT 12 05/24/2021  ? AST 14 05/24/2021  ? ALKPHOS 98 05/24/2021  ? BILITOT <0.2 05/24/2021  ? ?Lab Results  ?Component Value Date  ? HGBA1C 5.7 (H) 05/24/2021  ? HGBA1C 5.5 04/22/2013  ? ?Lab Results  ?Component Value Date  ? INSULIN 33.8 (H) 05/24/2021  ? ?Lab Results  ?Component Value Date  ? TSH 0.899 05/24/2021  ? ?Lab Results  ?Component Value Date  ? CHOL 142 05/24/2021  ? HDL 48 05/24/2021  ? [place] 82 05/24/2021  ? TRIG 59 05/24/2021  ? CHOLHDL 2.7 04/22/2013  ? ?Lab Results  ?Component Value Date  ? VD25OH 20.0 (L) 05/24/2021  ? ?Lab Results  ?Component Value Date  ? WBC 11.7 (H) 05/24/2021  ? HGB 10.4 (L) 05/24/2021  ? HCT 33.1 (L) 05/24/2021  ? MCV 70 (L) 05/24/2021  ? PLT 414 05/24/2021  ? ?Lab Results  ?Component Value Date  ? IRON 21 (L) 05/24/2021  ? TIBC 347 05/24/2021  ? FERRITIN 45 05/24/2021  ? ? ?Attestation Statements:  ? ?Reviewed by clinician on day of visit: allergies, medications, problem list, medical history, surgical history, family history, social history, and previous encounter notes. ? ?Total time spent on the visit was 20 minutes.  ? ?I, Lizbeth Bark, RMA, am acting as transcriptionist for [employer], DO. ? ?I have reviewed the above documentation for accuracy and completeness, and I agree with the above. Jearld Lesch, DO ? ?

## 2021-12-10 ENCOUNTER — Other Ambulatory Visit (INDEPENDENT_AMBULATORY_CARE_PROVIDER_SITE_OTHER): Payer: Self-pay | Admitting: Bariatrics

## 2021-12-10 DIAGNOSIS — E559 Vitamin D deficiency, unspecified: Secondary | ICD-10-CM

## 2021-12-10 DIAGNOSIS — R7303 Prediabetes: Secondary | ICD-10-CM

## 2021-12-10 DIAGNOSIS — M25561 Pain in right knee: Secondary | ICD-10-CM

## 2021-12-10 NOTE — Telephone Encounter (Signed)
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 Please advise. ? ?LAST APPOINTMENT DATE: 10/22/21 ?NEXT APPOINTMENT DATE: 12/27/21 ? ? ?[facility] - [place], [place] - [address] ?[address] ?[place] [address] ?Phone: [phone] Fax: [phone] ? ?Patient is requesting a refill of the following medications: ?Pending Prescriptions:                       Disp   Refills ?  meloxicam (MOBIC) 15 MG tablet             30 tab*0       ?Sig: Take 1 tablet (15 mg total) by mouth daily. ?  metFORMIN (GLUCOPHAGE) 500 MG tablet       60 tab*0       ?Sig: Take 1 tablet (500 mg total) by mouth 2 (two) times ?         daily with a meal. ?  Vitamin D, Ergocalciferol, (DRISDOL) 1.25 *4 caps*0       ?Sig: Take 1 capsule (50,000 Units total) by mouth every 7 ?         (seven) days. ? ? ?Date last filled: 10/22/21 ?Previously prescribed by Dr. Manson Passey ? ?Lab Results ?     Component                Value               Date                 ?     HGBA1C                   5.7 (H)             05/24/2021           ?     HGBA1C                   5.5                 04/22/2013           ?Lab Results ?     Component                Value               Date                 ?     LDLCALC                  82                  05/24/2021           ?     CREATININE               0.93                05/24/2021           ?Lab Results ?     Component                Value               Date                 ?     VD25OH  20.0 (L)            05/24/2021           ? ?BP Readings from Last 3 Encounters: ?09/06/21 : (!) 128/91 ?08/29/21 : 131/84 ?07/05/21 : 121/77 ?

## 2021-12-13 ENCOUNTER — Telehealth (INDEPENDENT_AMBULATORY_CARE_PROVIDER_SITE_OTHER): Payer: Self-pay | Admitting: Bariatrics

## 2021-12-13 ENCOUNTER — Other Ambulatory Visit (INDEPENDENT_AMBULATORY_CARE_PROVIDER_SITE_OTHER): Payer: Self-pay | Admitting: Bariatrics

## 2021-12-13 DIAGNOSIS — M25561 Pain in right knee: Secondary | ICD-10-CM

## 2021-12-13 NOTE — Telephone Encounter (Signed)
Dr.Brown 

## 2021-12-13 NOTE — Telephone Encounter (Signed)
Pt request return call to explain why medications were not refilled. Please advise. Pt is at work and if she does not answer, leave a message.

## 2021-12-19 NOTE — Telephone Encounter (Signed)
Meloxicam, Metformin and Vitamin D

## 2021-12-19 NOTE — Telephone Encounter (Signed)
LAST APPOINTMENT DATE: 10/22/21 NEXT APPOINTMENT DATE: 12/27/21   [facility] - [place], [place] [address] Phone: [phone] Fax: [phone]  Patient is requesting a refill of the following medications: Requested Prescriptions    No prescriptions requested or ordered in this encounter    Date last filled: 10/22/21 Previously prescribed by Dr. Owens Shark  Lab Results  Component Value Date   HGBA1C 5.7 (H) 05/24/2021   HGBA1C 5.5 04/22/2013   Lab Results  Component Value Date   LDLCALC 82 05/24/2021   CREATININE 0.93 05/24/2021   Lab Results  Component Value Date   VD25OH 20.0 (L) 05/24/2021    BP Readings from Last 3 Encounters:  09/06/21 (!) 128/91  08/29/21 131/84  07/05/21 121/77

## 2021-12-20 ENCOUNTER — Other Ambulatory Visit (INDEPENDENT_AMBULATORY_CARE_PROVIDER_SITE_OTHER): Payer: Self-pay | Admitting: Bariatrics

## 2021-12-20 DIAGNOSIS — R7303 Prediabetes: Secondary | ICD-10-CM

## 2021-12-20 DIAGNOSIS — E559 Vitamin D deficiency, unspecified: Secondary | ICD-10-CM

## 2021-12-20 DIAGNOSIS — M25561 Pain in right knee: Secondary | ICD-10-CM

## 2021-12-20 MED ORDER — METFORMIN HCL 500 MG PO TABS: 500.0000 mg | ORAL_TABLET | Freq: Two times a day (BID) | ORAL | 0 refills | Status: DC

## 2021-12-20 MED ORDER — MELOXICAM 15 MG PO TABS: 15.0000 mg | ORAL_TABLET | Freq: Every day | ORAL | 0 refills | Status: DC

## 2021-12-20 MED ORDER — VITAMIN D (ERGOCALCIFEROL) 1.25 MG (50000 UNIT) PO CAPS: 50000.0000 [IU] | ORAL_CAPSULE | ORAL | 0 refills | Status: DC

## 2021-12-27 ENCOUNTER — Ambulatory Visit (INDEPENDENT_AMBULATORY_CARE_PROVIDER_SITE_OTHER): Payer: Medicare Other | Admitting: Bariatrics

## 2022-01-09 ENCOUNTER — Other Ambulatory Visit (INDEPENDENT_AMBULATORY_CARE_PROVIDER_SITE_OTHER): Payer: Self-pay | Admitting: Bariatrics

## 2022-01-09 DIAGNOSIS — E559 Vitamin D deficiency, unspecified: Secondary | ICD-10-CM

## 2022-03-06 ENCOUNTER — Encounter (INDEPENDENT_AMBULATORY_CARE_PROVIDER_SITE_OTHER): Payer: Self-pay

## 2022-08-19 ENCOUNTER — Encounter (HOSPITAL_COMMUNITY): Payer: Self-pay

## 2022-08-19 ENCOUNTER — Ambulatory Visit (HOSPITAL_COMMUNITY)
Admission: EM | Admit: 2022-08-19 | Discharge: 2022-08-19 | Disposition: A | Discharge status: Home / Self Care | Payer: Medicare Other | Attending: Family Medicine | Admitting: Family Medicine

## 2022-08-19 DIAGNOSIS — J029 Acute pharyngitis, unspecified: Secondary | ICD-10-CM | POA: Diagnosis not present

## 2022-08-19 DIAGNOSIS — Z1152 Encounter for screening for COVID-19: Secondary | ICD-10-CM | POA: Insufficient documentation

## 2022-08-19 DIAGNOSIS — J069 Acute upper respiratory infection, unspecified: Secondary | ICD-10-CM | POA: Diagnosis present

## 2022-08-19 DIAGNOSIS — R059 Cough, unspecified: Secondary | ICD-10-CM | POA: Insufficient documentation

## 2022-08-19 LAB — SARS CORONAVIRUS 2 (TAT 6-24 HRS): SARS Coronavirus 2: NEGATIVE

## 2022-08-19 LAB — POC INFLUENZA A AND B ANTIGEN (URGENT CARE ONLY)
INFLUENZA A ANTIGEN, POC: NEGATIVE
INFLUENZA B ANTIGEN, POC: NEGATIVE

## 2022-08-19 LAB — POCT RAPID STREP A, ED / UC: Streptococcus, Group A Screen (Direct): NEGATIVE

## 2022-08-19 MED ORDER — BENZONATATE 100 MG PO CAPS: 100.0000 mg | ORAL_CAPSULE | Freq: Three times a day (TID) | ORAL | 0 refills | Status: DC | PRN

## 2022-08-19 NOTE — ED Provider Notes (Signed)
[facility]    CSN: [id number] Arrival date & time: 08/19/22  1761      History   Chief Complaint Chief Complaint  Patient presents with   Sore Throat    HPI Misty Moses is a 22 y.o. female.    Sore Throat   Here for sore throat and right ear pain that began on January 20.  She is also had a slight cough.  No nausea or vomiting but she has had some diarrhea.  No fever noted.  She was exposed to a child with strep throat on January 19.  Last menstrual cycle was December 24.  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   Gait disturbance 10/14/2017   Weakness 10/14/2017   Nonintractable episodic headache 07/16/2017   New ACL tear, right, initial encounter 06/04/2017   Acute medial meniscus tear of right knee 06/04/2017   GAD (generalized anxiety disorder) 04/22/2013   Central auditory processing disorder (CAPD) 04/22/2013   Nexplanon insertion 03/16/2013   Contraception 03/16/2013    Past Surgical History:  Procedure Laterality Date   ANTERIOR CRUCIATE LIGAMENT REPAIR Right 06/04/2017   Procedure: Right knee arthroscopic hamsting anterior cruciate ligament reconstruction with partial  medial menisectomy;  Surgeon: Yolonda Kida, MD;  Location: [facility];  Service: Orthopedics;  Laterality: Right;   TONSILLECTOMY      OB History     Gravida  0   Para  0   Term  0   Preterm  0   AB  0   Living  0      SAB  0   IAB  0   Ectopic  0   Multiple  0   Live  Births               Home Medications    Prior to Admission medications   Medication Sig Start Date End Date Taking? Authorizing Provider  benzonatate (TESSALON) 100 MG capsule Take 1 capsule (100 mg total) by mouth 3 (three) times daily as needed for cough. 08/19/22  Yes Zenia Resides, MD  escitalopram (LEXAPRO) 10 MG tablet Take 1 tablet (10 mg total) by mouth daily. 12/10/17  Yes Denzil Magnuson, NP  meloxicam (MOBIC) 15 MG tablet Take 1 tablet (15 mg total) by mouth daily. 12/20/21  Yes Corinna Capra A, DO  metFORMIN (GLUCOPHAGE) 500 MG tablet Take 1 tablet (500 mg total) by mouth 2 (two) times daily with a meal. 12/20/21  Yes Corinna Capra A, DO  Oxcarbazepine (TRILEPTAL) 300 MG tablet Take 1 tablet (300 mg total) by mouth 2 (two) times daily. 12/09/17  Yes Denzil Magnuson, NP  topiramate (TOPAMAX) 200 MG tablet Take 200 mg by mouth 2 (two) times daily.   Yes [provider]  traZODone (DESYREL) 150 MG tablet Take 1 tablet (150 mg total) by mouth at bedtime as needed for sleep. 04/12/21  Yes Layla Barter, NP  Family History Family History  Problem Relation Age of Onset   Hypertension Mother    Migraines Mother    Anxiety disorder Mother    ADD / ADHD Mother    Depression Mother    Bipolar disorder Mother    Seizures Neg Hx    Autism Neg Hx    Schizophrenia Neg Hx     Social History Social History   Tobacco Use   Smoking status: Never   Smokeless tobacco: Never  Vaping Use   Vaping Use: Never used  Substance Use Topics   Alcohol use: No   Drug use: No     Allergies   Hydrocodone   Review of Systems Review of Systems   Physical Exam Triage Vital Signs ED Triage Vitals  Enc Vitals Group     BP 08/19/22 1011 124/84     Pulse Rate 08/19/22 1011 96     Resp 08/19/22 1011 16     Temp 08/19/22 1011 98.2 F (36.8 C)     Temp Source 08/19/22 1011 Oral     SpO2 08/19/22 1011 98 %     Weight --      Height --      Head Circumference --       Peak Flow --      Pain Score 08/19/22 1007 7     Pain Loc --      Pain Edu? --      Excl. in [place]? --    No data found.  Updated Vital Signs BP 124/84 (BP Location: Left Arm)   Pulse 96   Temp 98.2 F (36.8 C) (Oral)   Resp 16   LMP 07/21/2022   SpO2 98%   Visual Acuity Right Eye Distance:   Left Eye Distance:   Bilateral Distance:    Right Eye Near:   Left Eye Near:    Bilateral Near:     Physical Exam Vitals reviewed.  Constitutional:      General: She is not in acute distress.    Appearance: She is not toxic-appearing.  HENT:     Right Ear: Tympanic membrane and ear canal normal.     Left Ear: Tympanic membrane and ear canal normal.     Nose: Nose normal.     Mouth/Throat:     Mouth: Mucous membranes are moist.     Comments: There is erythema of the posterior oropharynx. Eyes:     Extraocular Movements: Extraocular movements intact.     Conjunctiva/sclera: Conjunctivae normal.     Pupils: Pupils are equal, round, and reactive to light.  Cardiovascular:     Rate and Rhythm: Normal rate and regular rhythm.     Heart sounds: No murmur heard. Pulmonary:     Effort: Pulmonary effort is normal. No respiratory distress.     Breath sounds: No stridor. No wheezing, rhonchi or rales.  Musculoskeletal:     Cervical back: Neck supple.  Lymphadenopathy:     Cervical: No cervical adenopathy.  Skin:    Capillary Refill: Capillary refill takes less than 2 seconds.     Coloration: Skin is not jaundiced or pale.  Neurological:     General: No focal deficit present.     Mental Status: She is alert and oriented to person, place, and time.  Psychiatric:        Behavior: Behavior normal.      UC Treatments / Results  Labs (all labs ordered are listed, but only abnormal results are displayed)  Labs Reviewed  CULTURE, GROUP A STREP ([place])  SARS CORONAVIRUS 2 (TAT 6-24 HRS)  POCT RAPID STREP A, ED / UC  POC INFLUENZA A AND B ANTIGEN (URGENT CARE ONLY)     EKG   Radiology No results found.  Procedures Procedures (including critical care time)  Medications Ordered in UC Medications - No data to display  Initial Impression / Assessment and Plan / UC Course  I have reviewed the triage vital signs and the nursing notes.  Pertinent labs & imaging results that were available during my care of the patient were reviewed by me and considered in my medical decision making (see chart for details).       Rapid strep is negative so throat culture is sent.  Will treat per protocol if positive Flu swab is also negative, and it was done due to her employer requesting it.  COVID swab was done, and if positive she is a candidate for Paxlovid.  Her EGFR was 90 in October 2022 Final Clinical Impressions(s) / UC Diagnoses   Final diagnoses:  Acute pharyngitis, unspecified etiology  Viral URI with cough     Discharge Instructions      Your strep test is negative.  Culture of the throat will be sent, and staff will notify you if that is in turn positive.  Your flu test was negative   You have been swabbed for COVID, and the test will result in the next 24 hours. Our staff will call you if positive. If the COVID test is positive, you should quarantine for 5 days from the start of your symptoms.  On days 6-10 from the start of your illness, you should wear a mask if out in public.  Take benzonatate 100 mg, 1 tab every 8 hours as needed for cough.  Use Cepacol spray to help her sore throat.  Make sure you are getting fluids in     ED Prescriptions     Medication Sig Dispense Auth. Provider   benzonatate (TESSALON) 100 MG capsule Take 1 capsule (100 mg total) by mouth 3 (three) times daily as needed for cough. 21 capsule Barrett Henle, MD      PDMP not reviewed this encounter.   Barrett Henle, MD 08/19/22 [phone]

## 2022-08-19 NOTE — Discharge Instructions (Signed)
Your strep test is negative.  Culture of the throat will be sent, and staff will notify you if that is in turn positive.  Your flu test was negative   You have been swabbed for COVID, and the test will result in the next 24 hours. Our staff will call you if positive. If the COVID test is positive, you should quarantine for 5 days from the start of your symptoms.  On days 6-10 from the start of your illness, you should wear a mask if out in public.  Take benzonatate 100 mg, 1 tab every 8 hours as needed for cough.  Use Cepacol spray to help her sore throat.  Make sure you are getting fluids in

## 2022-08-19 NOTE — ED Triage Notes (Signed)
Pt has been exposed to Strep, pt has painful swallowing neck pain , and ear pain x 2days the patient employer wants pt to be checked for flu and Covid.

## 2022-08-21 LAB — CULTURE, GROUP A STREP (THRC)

## 2022-09-04 ENCOUNTER — Ambulatory Visit
Admission: EM | Admit: 2022-09-04 | Discharge: 2022-09-04 | Disposition: A | Discharge status: Home / Self Care | Payer: Medicare Other | Attending: Physician Assistant | Admitting: Physician Assistant

## 2022-09-04 DIAGNOSIS — Z113 Encounter for screening for infections with a predominantly sexual mode of transmission: Secondary | ICD-10-CM | POA: Diagnosis present

## 2022-09-04 DIAGNOSIS — Z3202 Encounter for pregnancy test, result negative: Secondary | ICD-10-CM | POA: Diagnosis present

## 2022-09-04 LAB — POCT URINE PREGNANCY: Preg Test, Ur: NEGATIVE

## 2022-09-04 NOTE — ED Provider Notes (Signed)
[facility]    CSN: [id number] Arrival date & time: 09/04/22  1837      History   Chief Complaint No chief complaint on file.   HPI Misty Moses is a 22 y.o. female.   HPI  Past Medical History:  Diagnosis Date   ADD (attention deficit disorder with hyperactivity)    Anxiety    Astigmatism    Back pain    Bipolar 1 disorder (HCC)    Borderline diabetes    TAKES METFORMIN AS PRECAUTION   Constipation    Depression    Excessive anger    EXPLOSIVE ANGER MANAGEMENT ISSUES   High blood pressure    Lactose intolerance     Patient Active Problem List   Diagnosis Date Noted   MDD (major depressive disorder), recurrent severe, without psychosis ([place]) 12/02/2017   Gait disturbance 10/14/2017   Weakness 10/14/2017   Nonintractable episodic headache 07/16/2017   New ACL tear, right, initial encounter 06/04/2017   Acute medial meniscus tear of right knee 06/04/2017   GAD (generalized anxiety disorder) 04/22/2013   Central auditory processing disorder (CAPD) 04/22/2013   Nexplanon insertion 03/16/2013   Contraception 03/16/2013    Past Surgical History:  Procedure Laterality Date   ANTERIOR CRUCIATE LIGAMENT REPAIR Right 06/04/2017   Procedure: Right knee arthroscopic hamsting anterior cruciate ligament reconstruction with partial  medial menisectomy;  Surgeon: Nicholes Stairs, MD;  Location: [facility];  Service: Orthopedics;  Laterality: Right;   TONSILLECTOMY      OB History     Gravida  0   Para  0   Term  0   Preterm  0   AB  0   Living  0      SAB  0   IAB  0   Ectopic  0   Multiple  0   Live Births               Home Medications    Prior to Admission medications   Medication Sig Start Date End Date Taking? Authorizing Provider  benzonatate (TESSALON) 100 MG capsule Take 1 capsule (100 mg total) by mouth 3 (three) times daily as needed for cough. 08/19/22   Barrett Henle, MD  escitalopram  (LEXAPRO) 10 MG tablet Take 1 tablet (10 mg total) by mouth daily. 12/10/17   Mordecai Maes, NP  meloxicam (MOBIC) 15 MG tablet Take 1 tablet (15 mg total) by mouth daily. 12/20/21   Jearld Lesch A, DO  metFORMIN (GLUCOPHAGE) 500 MG tablet Take 1 tablet (500 mg total) by mouth 2 (two) times daily with a meal. 12/20/21   Jearld Lesch A, DO  Oxcarbazepine (TRILEPTAL) 300 MG tablet Take 1 tablet (300 mg total) by mouth 2 (two) times daily. 12/09/17   Mordecai Maes, NP  topiramate (TOPAMAX) 200 MG tablet Take 200 mg by mouth 2 (two) times daily.    [provider]  traZODone (DESYREL) 150 MG tablet Take 1 tablet (150 mg total) by mouth at bedtime as needed for sleep. 04/12/21   White, Mellody Life, NP    Family History Family History  Problem Relation Age of Onset   Hypertension Mother    Migraines Mother    Anxiety disorder Mother    ADD / ADHD Mother    Depression Mother    Bipolar disorder Mother    Seizures Neg Hx    Autism Neg Hx    Schizophrenia Neg Hx     Social  History Social History   Tobacco Use   Smoking status: Never   Smokeless tobacco: Never  Vaping Use   Vaping Use: Never used  Substance Use Topics   Alcohol use: No   Drug use: No     Allergies   Hydrocodone   Review of Systems Review of Systems  Constitutional:  Negative for chills and fever.  Eyes:  Negative for discharge and redness.  Gastrointestinal:  Negative for abdominal pain, nausea and vomiting.  Genitourinary:  Negative for vaginal discharge.     Physical Exam Triage Vital Signs ED Triage Vitals  Enc Vitals Group     BP      Pulse      Resp      Temp      Temp src      SpO2      Weight      Height      Head Circumference      Peak Flow      Pain Score      Pain Loc      Pain Edu?      Excl. in [place]?    No data found.  Updated Vital Signs LMP 07/21/2022      Physical Exam Vitals and nursing note reviewed.  Constitutional:      General: She is not in acute  distress.    Appearance: Normal appearance. She is not ill-appearing.  HENT:     Head: Normocephalic and atraumatic.  Eyes:     Conjunctiva/sclera: Conjunctivae normal.  Cardiovascular:     Rate and Rhythm: Normal rate.  Pulmonary:     Effort: Pulmonary effort is normal.  Neurological:     Mental Status: She is alert.  Psychiatric:        Mood and Affect: Mood normal.        Behavior: Behavior normal.        Thought Content: Thought content normal.      UC Treatments / Results  Labs (all labs ordered are listed, but only abnormal results are displayed) Labs Reviewed - No data to display  EKG   Radiology No results found.  Procedures Procedures (including critical care time)  Medications Ordered in UC Medications - No data to display  Initial Impression / Assessment and Plan / UC Course  I have reviewed the triage vital signs and the nursing notes.  Pertinent labs & imaging results that were available during my care of the patient were reviewed by me and considered in my medical decision making (see chart for details).     *** Final Clinical Impressions(s) / UC Diagnoses   Final diagnoses:  None   Discharge Instructions   None    ED Prescriptions   None    PDMP not reviewed this encounter.

## 2022-09-04 NOTE — ED Triage Notes (Signed)
Pt c/o vaginal discharge onset ~ "a while."   Requesting sti panel including blood work and pregnancy.

## 2022-09-05 ENCOUNTER — Ambulatory Visit
Admission: EM | Admit: 2022-09-05 | Discharge: 2022-09-05 | Disposition: A | Discharge status: Home / Self Care | Payer: Medicare Other | Attending: Physician Assistant | Admitting: Physician Assistant

## 2022-09-05 ENCOUNTER — Encounter: Payer: Self-pay | Admitting: Physician Assistant

## 2022-09-05 ENCOUNTER — Telehealth (HOSPITAL_COMMUNITY): Payer: Self-pay | Admitting: Emergency Medicine

## 2022-09-05 ENCOUNTER — Encounter: Payer: Self-pay | Admitting: Emergency Medicine

## 2022-09-05 DIAGNOSIS — Z113 Encounter for screening for infections with a predominantly sexual mode of transmission: Secondary | ICD-10-CM

## 2022-09-05 LAB — CERVICOVAGINAL ANCILLARY ONLY
Bacterial Vaginitis (gardnerella): POSITIVE — AB
Candida Glabrata: NEGATIVE
Candida Vaginitis: NEGATIVE
Chlamydia: NEGATIVE
Comment: NEGATIVE
Comment: NEGATIVE
Comment: NEGATIVE
Comment: NEGATIVE
Comment: NEGATIVE
Comment: NORMAL
Neisseria Gonorrhea: POSITIVE — AB
Trichomonas: NEGATIVE

## 2022-09-05 MED ORDER — METRONIDAZOLE 500 MG PO TABS: 500.0000 mg | ORAL_TABLET | Freq: Two times a day (BID) | ORAL | 0 refills | Status: DC

## 2022-09-05 MED ORDER — CEFTRIAXONE SODIUM 1 G IJ SOLR
0.5000 g | Freq: Once | INTRAMUSCULAR | Status: AC
  Administered 2022-09-05: 0.5 g via INTRAMUSCULAR

## 2022-09-05 NOTE — ED Triage Notes (Signed)
Pt got message on mychart to go get treatment for gonorrhea.

## 2022-09-06 LAB — HIV ANTIBODY (ROUTINE TESTING W REFLEX): HIV Screen 4th Generation wRfx: NONREACTIVE

## 2022-09-06 LAB — RPR: RPR Ser Ql: NONREACTIVE

## 2022-11-28 ENCOUNTER — Ambulatory Visit
Admission: RE | Admit: 2022-11-28 | Discharge: 2022-11-28 | Disposition: A | Discharge status: Home / Self Care | Payer: Medicare Other | Source: Ambulatory Visit | Attending: Family Medicine | Admitting: Family Medicine

## 2022-11-28 VITALS — BP 127/94 | HR 97 | Temp 98.3°F | Resp 18

## 2022-11-28 DIAGNOSIS — L232 Allergic contact dermatitis due to cosmetics: Secondary | ICD-10-CM

## 2022-11-28 MED ORDER — CETIRIZINE HCL 10 MG PO TABS: 10.0000 mg | ORAL_TABLET | Freq: Every day | ORAL | 0 refills | Status: DC | PRN

## 2022-11-28 MED ORDER — PREDNISONE 20 MG PO TABS: 40.0000 mg | ORAL_TABLET | Freq: Every day | ORAL | 0 refills | Status: AC

## 2022-11-28 NOTE — ED Provider Notes (Signed)
[facility]    CSN: [id number] Arrival date & time: 11/28/22  1825      History   Chief Complaint Chief Complaint  Patient presents with   Allergic Reaction    HPI Misty Moses is a 22 y.o. female.    Allergic Reaction  Here for itching and rash on her chest.    About 2 weeks ago she had sprayed some new [employer] perfume on her wrist and then on the chest after shower.  The chest area started burning and she rinsed it off but it continued and he had some rash.  She then used the perfume and in the last couple days and it is feeling worse.  No trouble breathing and no lip swelling in the throat.   No cough or congestion or fever  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   Gait disturbance 10/14/2017   Weakness 10/14/2017   Nonintractable episodic headache 07/16/2017   New ACL tear, right, initial encounter 06/04/2017   Acute medial meniscus tear of right knee 06/04/2017   GAD (generalized anxiety disorder) 04/22/2013   Central auditory processing disorder (CAPD) 04/22/2013   Nexplanon insertion 03/16/2013   Contraception 03/16/2013    Past Surgical History:  Procedure Laterality Date   ANTERIOR CRUCIATE LIGAMENT REPAIR Right 06/04/2017   Procedure: Right knee arthroscopic hamsting anterior cruciate ligament reconstruction with partial  medial menisectomy;  Surgeon: Yolonda Kida, MD;  Location: [facility]  Service: Orthopedics;  Laterality: Right;   TONSILLECTOMY       OB History     Gravida  0   Para  0   Term  0   Preterm  0   AB  0   Living  0      SAB  0   IAB  0   Ectopic  0   Multiple  0   Live Births               Home Medications    Prior to Admission medications   Medication Sig Start Date End Date Taking? Authorizing Provider  amphetamine-dextroamphetamine (ADDERALL XR) 20 MG 24 hr capsule Take 20 mg by mouth every morning. 11/18/22  Yes [provider]  cetirizine (ZYRTEC ALLERGY) 10 MG tablet Take 1 tablet (10 mg total) by mouth daily as needed for allergies. 11/28/22  Yes Zenia Resides, MD  predniSONE (DELTASONE) 20 MG tablet Take 2 tablets (40 mg total) by mouth daily with breakfast for 5 days. 11/28/22 12/03/22 Yes Zenia Resides, MD  traZODone (DESYREL) 150 MG tablet Take 1 tablet (150 mg total) by mouth at bedtime as needed for sleep. 04/12/21   White, Chrystine Oiler, NP    Family History Family History  Problem Relation Age of Onset   Hypertension Mother    Migraines Mother    Anxiety disorder Mother    ADD / ADHD Mother    Depression Mother  Bipolar disorder Mother    Seizures Neg Hx    Autism Neg Hx    Schizophrenia Neg Hx     Social History Social History   Tobacco Use   Smoking status: Never   Smokeless tobacco: Never  Vaping Use   Vaping Use: Never used  Substance Use Topics   Alcohol use: No   Drug use: No     Allergies   Hydrocodone   Review of Systems Review of Systems   Physical Exam Triage Vital Signs ED Triage Vitals  Enc Vitals Group     BP 11/28/22 1906 (!) 127/94     Pulse Rate 11/28/22 1906 97     Resp 11/28/22 1906 18     Temp 11/28/22 1906 98.3 F (36.8 C)     Temp Source 11/28/22 1906 Oral     SpO2 11/28/22 1906 97 %     Weight --      Height --      Head Circumference --      Peak Flow --      Pain Score 11/28/22 1907 0     Pain Loc --      Pain Edu? --      Excl. in [place]? --    No data found.  Updated Vital Signs BP (!) 127/94 (BP  Location: Left Arm)   Pulse 97   Temp 98.3 F (36.8 C) (Oral)   Resp 18   LMP 11/02/2022 (Approximate)   SpO2 97%   Visual Acuity Right Eye Distance:   Left Eye Distance:   Bilateral Distance:    Right Eye Near:   Left Eye Near:    Bilateral Near:     Physical Exam Vitals reviewed.  Constitutional:      General: She is not in acute distress.    Appearance: She is not ill-appearing, toxic-appearing or diaphoretic.  HENT:     Mouth/Throat:     Mouth: Mucous membranes are moist.     Pharynx: No oropharyngeal exudate or posterior oropharyngeal erythema.  Eyes:     Extraocular Movements: Extraocular movements intact.     Conjunctiva/sclera: Conjunctivae normal.     Pupils: Pupils are equal, round, and reactive to light.  Cardiovascular:     Rate and Rhythm: Normal rate and regular rhythm.     Heart sounds: No murmur heard. Pulmonary:     Effort: Pulmonary effort is normal. No respiratory distress.     Breath sounds: No stridor. No wheezing, rhonchi or rales.  Musculoskeletal:     Cervical back: Neck supple.  Lymphadenopathy:     Cervical: No cervical adenopathy.  Skin:    Coloration: Skin is not jaundiced or pale.     Comments: There is a faint rash on the chest that is mostly macular and 3 to 5 mm in diameter.  No induration   Neurological:     General: No focal deficit present.     Mental Status: She is alert and oriented to person, place, and time.  Psychiatric:        Behavior: Behavior normal.      UC Treatments / Results  Labs (all labs ordered are listed, but only abnormal results are displayed) Labs Reviewed - No data to display  EKG   Radiology No results found.  Procedures Procedures (including critical care time)  Medications Ordered in UC Medications - No data to display  Initial Impression / Assessment and Plan / UC Course  I have reviewed the triage  vital signs and the nursing notes.  Pertinent labs & imaging results that were  available during my care of the patient were reviewed by me and considered in my medical decision making (see chart for details).        Prednisone is sent in for the allergic reaction and Zyrtec is sent in for the itching.  We discussed her getting the perfume away Final Clinical Impressions(s) / UC Diagnoses   Final diagnoses:  Allergic contact dermatitis due to cosmetics     Discharge Instructions      Take prednisone 20 mg--2 daily for 5 days  Zyrtec/cetirizine 10 mg tablet--take 1 daily as needed for allergy or itching.      ED Prescriptions     Medication Sig Dispense Auth. Provider   predniSONE (DELTASONE) 20 MG tablet Take 2 tablets (40 mg total) by mouth daily with breakfast for 5 days. 10 tablet Zenia Resides, MD   cetirizine (ZYRTEC ALLERGY) 10 MG tablet Take 1 tablet (10 mg total) by mouth daily as needed for allergies. 30 tablet Adrie Picking, Janace Aris, MD      PDMP not reviewed this encounter.   Zenia Resides, MD 11/28/22 [phone]

## 2022-11-28 NOTE — ED Triage Notes (Signed)
Patient states 2 weeks ago she tried new [employer] commercial. States she sprayed it on her chest and wrists. Shortly after she developed a itchy rash on her chest. States she did not try any OTC medication, but did use the perfume again and now has the rash again.

## 2022-11-28 NOTE — Discharge Instructions (Signed)
Take prednisone 20 mg--2 daily for 5 days  Zyrtec/cetirizine 10 mg tablet--take 1 daily as needed for allergy or itching. 

## 2023-03-10 ENCOUNTER — Ambulatory Visit (INDEPENDENT_AMBULATORY_CARE_PROVIDER_SITE_OTHER): Payer: Medicare Other | Admitting: Family

## 2023-03-10 ENCOUNTER — Encounter: Payer: Self-pay | Admitting: Family

## 2023-03-10 VITALS — BP 121/81 | HR 92 | Temp 98.3°F | Ht 64.5 in | Wt 294.0 lb

## 2023-03-10 DIAGNOSIS — F319 Bipolar disorder, unspecified: Secondary | ICD-10-CM | POA: Diagnosis not present

## 2023-03-10 DIAGNOSIS — Z91011 Allergy to milk products: Secondary | ICD-10-CM

## 2023-03-10 DIAGNOSIS — F909 Attention-deficit hyperactivity disorder, unspecified type: Secondary | ICD-10-CM | POA: Diagnosis not present

## 2023-03-10 DIAGNOSIS — F32A Depression, unspecified: Secondary | ICD-10-CM

## 2023-03-10 DIAGNOSIS — F419 Anxiety disorder, unspecified: Secondary | ICD-10-CM

## 2023-03-10 DIAGNOSIS — Z7689 Persons encountering health services in other specified circumstances: Secondary | ICD-10-CM

## 2023-03-10 MED ORDER — EPINEPHRINE 0.3 MG/0.3ML IJ SOAJ: 0.3000 mg | INTRAMUSCULAR | 1 refills | Status: DC | PRN

## 2023-03-10 NOTE — Progress Notes (Signed)
Subjective:    Misty Moses - 22 y.o. female MRN [id number]  Date of birth: 2000-10-19  HPI  Misty Moses is to establish care.   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 Reports several months ago she ate ice cream that had cashew milk that she was unaware of. Reports her tongue and throat became swollen. Reports EMS was called and she was given 5 Benadryl which helped. Requests referral to allergist.  - Reports established with Psychiatry for management of anxiety depression, bipolar, and ADHD. Requests referral to new psychiatrist. States her current psychiatrist does not communicate with her as she would prefer. She denies thoughts of self-harm, suicidal ideations, homicidal ideations. - No further issues/concerns for discussion today.    ROS per HPI    Health Maintenance:  Health Maintenance Due  Topic Date Due   Medicare Annual Wellness (AWV)  Never done   HPV VACCINES (1 - 3-dose series) Never done   Hepatitis C Screening  Never done   DTaP/Tdap/Td (1 - Tdap) Never done   PAP-Cervical Cytology Screening  Never done   PAP SMEAR-Modifier  Never done   COVID-19 Vaccine (1 - 2023-24 season) Never done   INFLUENZA VACCINE  02/27/2023     Past Medical History: Patient Active Problem List   Diagnosis Date Noted   MDD (major depressive disorder), recurrent severe, without psychosis (HCC) 12/02/2017   Gait disturbance 10/14/2017   Weakness 10/14/2017   Nonintractable episodic headache 07/16/2017   New ACL tear, right, initial encounter 06/04/2017   Acute medial meniscus tear of right knee 06/04/2017   GAD (generalized anxiety disorder) 04/22/2013   Central auditory processing disorder (CAPD) 04/22/2013   Nexplanon insertion 03/16/2013   Contraception 03/16/2013      Social History   reports that she has never smoked. She has never used smokeless tobacco. She reports that she does not drink alcohol and does not use drugs.   Family History   family history includes ADD / ADHD in her mother; Anxiety disorder in her mother; Bipolar disorder in her mother; Depression in her mother; Hypertension in her mother; Migraines in her mother.   Medications: reviewed and updated   Objective:   Physical Exam BP 121/81   Pulse 92   Temp 98.3 F (36.8 C) (Oral)   Ht 5' 4.5" (1.638 m)   Wt 294 lb (133.4 kg)   LMP 03/10/2023   SpO2 98%   BMI 49.69 kg/m   Physical Exam HENT:     Head: Normocephalic and atraumatic.     Nose: Nose normal.     Mouth/Throat:     Mouth: Mucous membranes are moist.     Pharynx: Oropharynx is clear.  Eyes:     Extraocular Movements: Extraocular movements intact.     Conjunctiva/sclera: Conjunctivae normal.     Pupils: Pupils are equal, round, and reactive to light.  Cardiovascular:     Rate and Rhythm: Normal rate and regular rhythm.     Pulses: Normal pulses.     Heart sounds: Normal heart sounds.  Pulmonary:     Effort: Pulmonary effort is normal.     Breath sounds: Normal breath sounds.  Musculoskeletal:        General: Normal range of motion.     Cervical back: Normal range of motion and neck supple.  Neurological:     General: No focal deficit present.     Mental Status: She is alert and oriented to person, place, and time.  Psychiatric:        Mood and Affect: Mood normal.        Behavior: Behavior normal.       Assessment & Plan:  1. Encounter to establish care - Patient presents today to establish care. During the interim follow-up with primary provider as scheduled.  - Return for annual physical examination, labs, and health maintenance. Arrive fasting meaning having no food for at least 8 hours prior to appointment. You may have only water or black coffee. Please take scheduled medications as normal.  2. Milk allergy - Epinephrine injection as prescribed. Counseled on medication adherence/adverse effects.  - Referral to Allergy for further evaluation/management. - Ambulatory  referral to Allergy - EPINEPHrine (EPIPEN 2-PAK) 0.3 mg/0.3 mL IJ SOAJ injection; Inject 0.3 mg into the muscle as needed for anaphylaxis.  Dispense: 2 each; Refill: 1  3. Anxiety and depression 4. Bipolar affective disorder, remission status unspecified (HCC) 5. Attention deficit hyperactivity disorder (ADHD), unspecified ADHD type - Patient denies thoughts of self-harm, suicidal ideations, homicidal ideations. - Referral to Psychiatry for further evaluation/management.  - Ambulatory referral to Psychiatry    Patient was given clear instructions to go to Emergency Department or return to medical center if symptoms don't improve, worsen, or new problems develop.The patient verbalized understanding.  I discussed the assessment and treatment plan with the patient. The patient was provided an opportunity to ask questions and all were answered. The patient agreed with the plan and demonstrated an understanding of the instructions.   The patient was advised to call back or seek an in-person evaluation if the symptoms worsen or if the condition fails to improve as anticipated.    Ricky Stabs, NP 03/10/2023, 3:17 PM Primary Care at [facility]

## 2023-03-10 NOTE — Patient Instructions (Signed)
Thank you for choosing Primary Care at [facility] for your medical home!    Misty Moses was seen by Rema Fendt, NP today.   Misty Moses's primary care provider is Ricky Stabs, NP.   For the best care possible,  you should try to see Ricky Stabs, NP whenever you come to office.   We look forward to seeing you again soon!  If you have any questions about your visit today,  please call us at [phone]  Or feel free to reach your provider via MyChart.   Keeping you healthy   Get these tests Blood pressure- Have your blood pressure checked once a year by your healthcare provider.  Normal blood pressure is 120/80. Weight- Have your body mass index (BMI) calculated to screen for obesity.  BMI is a measure of body fat based on height and weight. You can also calculate your own BMI at https://www.west-esparza.com/. Cholesterol- Have your cholesterol checked regularly starting at age 9, sooner may be necessary if you have diabetes, high blood pressure, if a family member developed heart diseases at an early age or if you smoke.  Chlamydia, HIV, and other sexual transmitted disease- Get screened each year until the age of 49 then within three months of each new sexual partner. Diabetes- Have your blood sugar checked regularly if you have high blood pressure, high cholesterol, a family history of diabetes or if you are overweight.   Get these vaccines Flu shot- Every fall. Tetanus shot- Every 10 years. Menactra- Single dose; prevents meningitis.   Take these steps Don't smoke- If you do smoke, ask your healthcare provider about quitting. For tips on how to quit, go to www.smokefree.gov or call 1-800-QUIT-NOW. Be physically active- Exercise 5 days a week for at least 30 minutes.  If you are not already physically active start slow and gradually work up to 30 minutes of moderate physical activity.  Examples of moderate activity include walking briskly, mowing the yard, dancing,  swimming bicycling, etc. Eat a healthy diet- Eat a variety of healthy foods such as fruits, vegetables, low fat milk, low fat cheese, yogurt, lean meats, poultry, fish, beans, tofu, etc.  For more information on healthy eating, go to www.thenutritionsource.org Drink alcohol in moderation- Limit alcohol intake two drinks or less a day.  Never drink and drive. Dentist- Brush and floss teeth twice daily; visit your dentis twice a year. Depression-Your emotional health is as important as your physical health.  If you're feeling down, losing interest in things you normally enjoy please talk with your healthcare provider. Gun Safety- If you keep a gun in your home, keep it unloaded and with the safety lock on.  Bullets should be stored separately. Helmet use- Always wear a helmet when riding a motorcycle, bicycle, rollerblading or skateboarding. Safe sex- If you may be exposed to a sexually transmitted infection, use a condom Seat belts- Seat bels can save your life; always wear one. Smoke/Carbon Monoxide detectors- These detectors need to be installed on the appropriate level of your home.  Replace batteries at least once a year. Skin Cancer- When out in the sun, cover up and use sunscreen SPF 15 or higher. Violence- If anyone is threatening or hurting you, please tell your healthcare provider.

## 2023-03-10 NOTE — Progress Notes (Signed)
Pt needs a referral for allergy testing.   Pt also needs a referral to another Psych Dr.

## 2023-04-15 ENCOUNTER — Encounter: Payer: Self-pay | Admitting: Internal Medicine

## 2023-04-15 ENCOUNTER — Ambulatory Visit (INDEPENDENT_AMBULATORY_CARE_PROVIDER_SITE_OTHER): Payer: Medicare Other | Admitting: Internal Medicine

## 2023-04-15 ENCOUNTER — Other Ambulatory Visit: Payer: Self-pay

## 2023-04-15 VITALS — BP 130/86 | HR 96 | Temp 97.7°F | Resp 18 | Ht 64.0 in | Wt 304.1 lb

## 2023-04-15 DIAGNOSIS — J393 Upper respiratory tract hypersensitivity reaction, site unspecified: Secondary | ICD-10-CM | POA: Diagnosis not present

## 2023-04-15 DIAGNOSIS — E739 Lactose intolerance, unspecified: Secondary | ICD-10-CM

## 2023-04-15 DIAGNOSIS — J3089 Other allergic rhinitis: Secondary | ICD-10-CM | POA: Diagnosis not present

## 2023-04-15 MED ORDER — FLUTICASONE PROPIONATE 50 MCG/ACT NA SUSP: 2.0000 | Freq: Every day | NASAL | 5 refills | Status: DC

## 2023-04-15 NOTE — Patient Instructions (Addendum)
Other Allergic Rhinitis: - Use nasal saline rinses before nose sprays such as with Neilmed Sinus Rinse.  Use distilled water.   - Use Flonase 1-2 sprays each nostril daily. Aim upward and outward. - Hold all anti histamines (Zyrtec, Claritin, Allegra, Benadryl, Hydroxyzine) 3 days prior to next visit.  If possible hold the hydroxyzine also.    Food allergy:  - please strictly avoid treenuts.  - for SKIN only reaction, okay to take Benadryl 25mg  capsules every 6 hours as needed  Lactose Intolerance - Reaction to milk with GI symptoms is due to lactose intolerance. This is not an allergic reaction. Can eat lactose free dairy products or take Lactaid pills.   Follow up: 8:30 AM on Friday 8/20 for skin testing with me; okay to overbook

## 2023-04-15 NOTE — Progress Notes (Signed)
NEW PATIENT  Date of Service/Encounter:  04/15/23  Consult requested by: Pcp, No   Subjective:   Misty Moses (DOB: [birth year]) is a 22 y.o. female who presents to the clinic on 04/15/2023 with a chief complaint of Allergy Testing (Environmental: ALL/Food: Cashews) .    History obtained from: chart review and patient.   Rhinitis:  Started around elementary-middle school.  Symptoms include:  nosebleeds, nasal congestion, rhinorrhea, post nasal drainage, and sneezing  Occurs seasonally-Spring/Summer Potential triggers: not sure Treatments tried:  OTC nasal spray Cold paper towel for nosebleeds and pinching nose   Previous allergy testing: no History of sinus surgery: no Nonallergic triggers: none   Concern for Food Allergy:  Drank cashew milk in early August and then developed throat itching and swelling with coughing.  Hard time talking.  Given benadryl by EMS.  Did not have to go to the ER.  Also has trouble with frequent panic attacks and anxiety.  Has not tried treenuts in the past. She is a picky eater.  Does eat dairy but causes abdominal pain/diarrhea so uses lactaid pills or avoids it.     Past Medical History: Past Medical History:  Diagnosis Date   ADD (attention deficit disorder with hyperactivity)    Anxiety    Astigmatism    Back pain    Bipolar 1 disorder (HCC)    Borderline diabetes    TAKES METFORMIN AS PRECAUTION   Constipation    Depression    Excessive anger    EXPLOSIVE ANGER MANAGEMENT ISSUES   High blood pressure    Lactose intolerance      Past Surgical History: Past Surgical History:  Procedure Laterality Date   ANTERIOR CRUCIATE LIGAMENT REPAIR Right 06/04/2017   Procedure: Right knee arthroscopic hamsting anterior cruciate ligament reconstruction with partial  medial menisectomy;  Surgeon: Yolonda Kida, MD;  Location: [facility];  Service: Orthopedics;  Laterality: Right;   TONSILLECTOMY      Family  History: Family History  Problem Relation Age of Onset   Hypertension Mother    Migraines Mother    Anxiety disorder Mother    ADD / ADHD Mother    Depression Mother    Bipolar disorder Mother    Seizures Neg Hx    Autism Neg Hx    Schizophrenia Neg Hx     Social History:  Flooring in bedroom: carpet Pets: dog Tobacco use/exposure: none Job: unemployed   Medication List:  Allergies as of 04/15/2023       Reactions   Hydrocodone Other (See Comments)   Dizziness        Medication List        Accurate as of April 15, 2023  9:10 AM. If you have any questions, ask your nurse or doctor.          STOP taking these medications    amphetamine-dextroamphetamine 20 MG 24 hr capsule Commonly known as: ADDERALL XR Stopped by: Birder Robson   cetirizine 10 MG tablet Commonly known as: ZyrTEC Allergy Stopped by: Birder Robson       TAKE these medications    EPINEPHrine 0.3 mg/0.3 mL Soaj injection Commonly known as: EpiPen 2-Pak Inject 0.3 mg into the muscle as needed for anaphylaxis.   escitalopram 10 MG tablet Commonly known as: LEXAPRO Take 15 mg by mouth at bedtime.   fluticasone 50 MCG/ACT nasal spray Commonly known as: FLONASE Place 2 sprays into both nostrils daily. Started by: Birder Robson  hydrOXYzine 25 MG tablet Commonly known as: ATARAX Take 25-50 mg by mouth at bedtime as needed.   Lisdexamfetamine Dimesylate 40 MG Chew Chew 1 tablet by mouth every morning.   oxcarbazepine 600 MG tablet Commonly known as: TRILEPTAL Take 600 mg by mouth 2 (two) times daily.   topiramate 200 MG tablet Commonly known as: TOPAMAX Take 200 mg by mouth at bedtime.   traZODone 150 MG tablet Commonly known as: DESYREL Take 1 tablet (150 mg total) by mouth at bedtime as needed for sleep.         REVIEW OF SYSTEMS: Pertinent positives and negatives discussed in HPI.   Objective:   Physical Exam: BP 130/86 (BP Location: Right Arm, Patient  Position: Sitting, Cuff Size: Large)   Pulse 96   Temp 97.7 F (36.5 C) (Temporal)   Resp 18   Ht 5\' 4"  (1.626 m)   Wt (!) 304 lb 1.6 oz (137.9 kg)   SpO2 99%   BMI 52.20 kg/m  Body mass index is 52.2 kg/m. GEN: alert, well developed HEENT: clear conjunctiva, TM grey and translucent, nose with + moderate inferior turbinate hypertrophy, pink nasal mucosa, + clear rhinorrhea, no cobblestoning HEART: regular rate and rhythm, no murmur LUNGS: clear to auscultation bilaterally, no coughing, unlabored respiration ABDOMEN: soft, non distended  SKIN: no rashes or lesions  Reviewed:  03/10/2023: seen by Zonia Kief NP for tongue and throat swelling after eating ice cream with cashew milk.  Also with hx of milk allergy?  Given benadryl at home.  Referred to Allergy.  11/28/2022: seen in ED for rash on chest and wrist, sprayed new perfume. Discussed likely contact dermatitis.  Started prednisone and zyrtec.  08/19/2022: seen in ED for sore throat, cough, ear pain. Thought to be viral illness. Symptomatic care at home.    Assessment:   1. Other allergic rhinitis   2. Upper respiratory tract hypersensitivity reaction   3. Lactose intolerance     Plan/Recommendations:  Other Allergic Rhinitis: - Histamine today was non reactive likely due to hydroxyzine use.  - Due to turbinate hypertrophy, seasonal symptoms and unresponsive to over the counter meds, will perform skin testing to identify aeroallergen triggers.   - Use nasal saline rinses before nose sprays such as with Neilmed Sinus Rinse.  Use distilled water.   - Use Flonase 1-2 sprays each nostril daily. Aim upward and outward. - Hold all anti histamines (Zyrtec, Claritin, Allegra, Benadryl, Hydroxyzine) 3 days prior to next visit.  If possible hold the hydroxyzine also.    Food allergy:  - please strictly avoid treenuts.  - Initial rxn: sensation of throat itching, swelling, trouble talking.  Also with hx of panic attacks and anxiety  disorder so she isn't sure if that was contributing.  - for SKIN only reaction, okay to take Benadryl 25mg  capsules every 6 hours as needed  Lactose Intolerance - Reaction to milk with GI symptoms is due to lactose intolerance. This is not an allergic reaction. Can eat lactose free dairy products or take Lactaid pills.    Follow up: 8:30 AM on Friday 8/20 for skin testing with me for 1-55 + treenuts   Return in about 3 days (around 04/18/2023).  Alesia Morin, MD [facility]

## 2023-04-18 ENCOUNTER — Ambulatory Visit (INDEPENDENT_AMBULATORY_CARE_PROVIDER_SITE_OTHER): Payer: Medicare Other | Admitting: Internal Medicine

## 2023-04-18 ENCOUNTER — Encounter: Payer: Self-pay | Admitting: Internal Medicine

## 2023-04-18 VITALS — BP 126/78 | HR 80 | Temp 98.3°F

## 2023-04-18 DIAGNOSIS — J301 Allergic rhinitis due to pollen: Secondary | ICD-10-CM | POA: Diagnosis not present

## 2023-04-18 DIAGNOSIS — T7805XD Anaphylactic reaction due to tree nuts and seeds, subsequent encounter: Secondary | ICD-10-CM | POA: Diagnosis not present

## 2023-04-18 DIAGNOSIS — J3089 Other allergic rhinitis: Secondary | ICD-10-CM | POA: Diagnosis not present

## 2023-04-18 MED ORDER — CETIRIZINE HCL 10 MG PO TABS: 10.0000 mg | ORAL_TABLET | Freq: Every day | ORAL | 5 refills | Status: DC

## 2023-04-18 MED ORDER — AZELASTINE HCL 0.1 % NA SOLN: 1.0000 | Freq: Two times a day (BID) | NASAL | 5 refills | Status: DC | PRN

## 2023-04-18 MED ORDER — EPINEPHRINE 0.3 MG/0.3ML IJ SOAJ: 0.3000 mg | INTRAMUSCULAR | 1 refills | Status: DC | PRN

## 2023-04-18 NOTE — Patient Instructions (Addendum)
Allergic Rhinitis: - Positive skin test 03/2023: trees, grasses, weeds, molds  - Avoidance measures discussed. - Use nasal saline rinses before nose sprays such as with Neilmed Sinus Rinse.  Use distilled water.   - Use Flonase 2 sprays each nostril daily. Aim upward and outward. - Use Azelastine 1-2 sprays each nostril twice daily as needed for runny nose, drainage, sneezing, congestion. Aim upward and outward. - Use Zyrtec 10 mg daily as needed for runny nose, sneezing, itchy watery eyes.  - Consider allergy shots as long term control of your symptoms by teaching your immune system to be more tolerant of your allergy triggers   Food allergy:  - please strictly avoid treenuts  - for SKIN only reaction, okay to take Benadryl 25mg  capsules every 6 hours as needed - for SKIN + ANY additional symptoms, OR IF concern for LIFE THREATENING reaction = Epipen Autoinjector EpiPen 0.3 mg. - If using Epinephrine autoinjector, call 911 or go to the ER.    ALLERGEN AVOIDANCE MEASURES  Molds - Indoor avoidance Use air conditioning to reduce indoor humidity.  Do not use a humidifier. Keep indoor humidity at 30 - 40%.  Use a dehumidifier if needed. In the bathroom use an exhaust fan or open a window after showering.  Wipe down damp surfaces after showering.  Clean bathrooms with a mold-killing solution (diluted bleach, or products like Tilex, etc) at least once a month. In the kitchen use an exhaust fan to remove steam from cooking.  Throw away spoiled foods immediately, and empty garbage daily.  Empty water pans below self-defrosting refrigerators frequently. Vent the clothes dryer to the outside. Limit indoor houseplants; mold grows in the dirt.  No houseplants in the bedroom. Remove carpet from the bedroom. Encase the mattress and box springs with a zippered encasing.  Molds - Outdoor avoidance Avoid being outside when the grass is being mowed, or the ground is tilled. Avoid playing in leaves, pine  straw, hay, etc.  Dead plant materials contain mold. Avoid going into barns or grain storage areas. Remove leaves, clippings and compost from around the home.  Pollen Avoidance Pollen levels are highest during the mid-day and afternoon.  Consider this when planning outdoor activities. Avoid being outside when the grass is being mowed, or wear a mask if the pollen-allergic person must be the one to mow the grass. Keep the windows closed to keep pollen outside of the home. Use an air conditioner to filter the air. Take a shower, wash hair, and change clothing after working or playing outdoors during pollen season.

## 2023-04-18 NOTE — Progress Notes (Signed)
FOLLOW UP Date of Service/Encounter:  04/18/23   Subjective:  Misty Moses (DOB: 01/13/2001) is a 22 y.o. female who [facility] for follow up for skin testing.   History obtained from: chart review and patient. Anti histamines held.   Past Medical History: Past Medical History:  Diagnosis Date   ADD (attention deficit disorder with hyperactivity)    Anxiety    Astigmatism    Back pain    Bipolar 1 disorder (HCC)    Borderline diabetes    TAKES METFORMIN AS PRECAUTION   Constipation    Depression    Excessive anger    EXPLOSIVE ANGER MANAGEMENT ISSUES   High blood pressure    Lactose intolerance     Objective:  BP 126/78   Pulse 80   Temp 98.3 F (36.8 C)   SpO2 98%  There is no height or weight on file to calculate BMI. Physical Exam: GEN: alert, well developed HEENT: clear conjunctiva, MMM HEART: regular rate  LUNGS: no coughing, unlabored respiration SKIN: no rashes or lesions  Skin Testing:  Skin prick testing was placed, which includes aeroallergens/foods, histamine control, and saline control.  Verbal consent was obtained prior to placing test.  Patient tolerated procedure well.  Allergy testing results were read and interpreted by myself, documented by clinical staff. Adequate positive and negative control.  Positive results to:  Results discussed with patient/family.  Airborne Adult Perc - 04/18/23 0800     Time Antigen Placed 2841    Allergen Manufacturer Waynette Buttery    Location Back    Number of Test 55    1. Control-Buffer 50% Glycerol Negative    2. Control-Histamine 3+    3. Bahia Negative    4. French Southern Territories Negative    5. Johnson Negative    6. Kentucky Blue 3+    7. Meadow Fescue 3+    8. Perennial Rye 3+    9. Timothy 3+    10. Ragweed Mix Negative    11. Cocklebur 2+    12. Plantain,  English Negative    13. Baccharis Negative    14. Dog Fennel Negative    15. Guernsey Thistle 2+    16. Lamb's  Quarters Negative    17. Sheep Sorrell Negative    18. Rough Pigweed Negative    19. Marsh Elder, Rough Negative    20. Mugwort, Common Negative    21. Box, Elder Negative    22. Cedar, red Negative    23. Sweet Gum Negative    24. Pecan Pollen Negative    25. Pine Mix Negative    26. Walnut, Black Pollen 2+    27. Red Mulberry Negative    28. Ash Mix Negative    29. Birch Mix Negative    30. Beech American Negative    31. Cottonwood, Guinea-Bissau Negative    32. Hickory, White Negative    33. Maple Mix Negative    34. Oak, Guinea-[place] Mix Negative    35. Sycamore Eastern Negative    36. Alternaria Alternata 2+    37. Cladosporium Herbarum Negative    38. Aspergillus Mix Negative    39. Penicillium Mix Negative    40. Bipolaris Sorokiniana (Helminthosporium) Negative    41. Drechslera Spicifera (Curvularia) Negative    42. Mucor Plumbeus Negative    43. Fusarium Moniliforme Negative    44. Aureobasidium Pullulans (pullulara) Negative    45. Rhizopus Oryzae 2+  46. Botrytis Cinera 2+    47. Epicoccum Nigrum Negative    48. Phoma Betae Negative    49. Dust Mite Mix Negative    50. Cat Hair 10,000 BAU/ml Negative    51.  Dog Epithelia Negative    52. Mixed Feathers Negative    53. Horse Epithelia Negative    54. Cockroach, German Negative    55. Tobacco Leaf Negative             Food Adult Perc - 04/18/23 0800     Time Antigen Placed 6045    Allergen Manufacturer Waynette Buttery    Location Back    Number of allergen test 8    1. Peanut 3+    10. Cashew --   15x7   11. Walnut Food Negative    12. Almond --   4x3   13. Hazelnut --   3x4   14. Pecan Food --   16x9   15. Pistachio --   3x4   16. Eston[place] Nut Negative    17. Coconut Omitted              Assessment:   1. Seasonal allergic rhinitis due to pollen   2. Allergic rhinitis caused by mold   3. Anaphylaxis due to tree nut, subsequent encounter     Plan/Recommendations:  Allergic Rhinitis: - Due to turbinate  hypertrophy, seasonal symptoms and unresponsive to over the counter meds, performed skin testing to identify aeroallergen triggers.   - Positive skin test 03/2023: trees, grasses, weeds, molds  - Avoidance measures discussed. - Use nasal saline rinses before nose sprays such as with Neilmed Sinus Rinse.  Use distilled water.   - Use Flonase 2 sprays each nostril daily. Aim upward and outward. - Use Azelastine 1-2 sprays each nostril twice daily as needed for runny nose, drainage, sneezing, congestion. Aim upward and outward. - Use Zyrtec 10 mg daily as needed for runny nose, sneezing, itchy watery eyes.  - Consider allergy shots as long term control of your symptoms by teaching your immune system to be more tolerant of your allergy triggers   Food allergy:  - please strictly avoid treenuts  - SPT 03/2023 positive to cashew, almond, hazelnut, pecan, pistachio. Negative to walnut and Eston[place] nut.  - Initial rxn: sensation of throat itching, swelling, trouble talking  - for SKIN only reaction, okay to take Benadryl 25mg  capsules every 6 hours as needed - for SKIN + ANY additional symptoms, OR IF concern for LIFE THREATENING reaction = Epipen Autoinjector EpiPen 0.3 mg. - If using Epinephrine autoinjector, call 911 or go to the ER.    ALLERGEN AVOIDANCE MEASURES  Molds - Indoor avoidance Use air conditioning to reduce indoor humidity.  Do not use a humidifier. Keep indoor humidity at 30 - 40%.  Use a dehumidifier if needed. In the bathroom use an exhaust fan or open a window after showering.  Wipe down damp surfaces after showering.  Clean bathrooms with a mold-killing solution (diluted bleach, or products like Tilex, etc) at least once a month. In the kitchen use an exhaust fan to remove steam from cooking.  Throw away spoiled foods immediately, and empty garbage daily.  Empty water pans below self-defrosting refrigerators frequently. Vent the clothes dryer to the outside. Limit indoor  houseplants; mold grows in the dirt.  No houseplants in the bedroom. Remove carpet from the bedroom. Encase the mattress and box springs with a zippered encasing.  Molds - Outdoor avoidance Avoid being  outside when the grass is being mowed, or the ground is tilled. Avoid playing in leaves, pine straw, hay, etc.  Dead plant materials contain mold. Avoid going into barns or grain storage areas. Remove leaves, clippings and compost from around the home.  Pollen Avoidance Pollen levels are highest during the mid-day and afternoon.  Consider this when planning outdoor activities. Avoid being outside when the grass is being mowed, or wear a mask if the pollen-allergic person must be the one to mow the grass. Keep the windows closed to keep pollen outside of the home. Use an air conditioner to filter the air. Take a shower, wash hair, and change clothing after working or playing outdoors during pollen season.    Return in about 6 weeks (around 05/30/2023).  Alesia Morin, MD [facility]

## 2023-05-12 ENCOUNTER — Ambulatory Visit (HOSPITAL_COMMUNITY): Payer: Medicare Other | Admitting: Student

## 2023-05-13 ENCOUNTER — Ambulatory Visit
Admission: EM | Admit: 2023-05-13 | Discharge: 2023-05-13 | Disposition: A | Discharge status: Home / Self Care | Payer: Medicare Other | Attending: Internal Medicine | Admitting: Internal Medicine

## 2023-05-13 DIAGNOSIS — Z3202 Encounter for pregnancy test, result negative: Secondary | ICD-10-CM | POA: Insufficient documentation

## 2023-05-13 DIAGNOSIS — Z113 Encounter for screening for infections with a predominantly sexual mode of transmission: Secondary | ICD-10-CM | POA: Insufficient documentation

## 2023-05-13 LAB — POCT URINE PREGNANCY: Preg Test, Ur: NEGATIVE

## 2023-05-13 NOTE — ED Provider Notes (Signed)
[facility]    CSN: [id number] Arrival date & time: 05/13/23  1036      History   Chief Complaint Chief Complaint  Patient presents with   std check   Vomiting    X2 weeks    HPI Misty Moses is a 22 y.o. female.   Patient presents today for routine STD testing.  Patient denies any exposure to STD.  Reports she last had unprotected sexual intercourse sometime in August.  Last menstrual cycle was early October but patient states that she only had vaginal bleeding for 1 day which is unusual for her.  Last normal menstrual cycle was a month prior.  Patient denies any associated STD symptoms but does mention that she had a few episodes of vomiting this morning and wanted to mention this in case it is related to an STD.  States that she thinks it was due to her taking her vitamins on an empty stomach.  Denies abdominal pain, blood in emesis, diarrhea, constipation.  Patient reports normal bowel movements.     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   Gait disturbance 10/14/2017   Weakness 10/14/2017   Nonintractable episodic headache 07/16/2017   New ACL tear, right, initial encounter 06/04/2017   Acute medial meniscus tear of right knee 06/04/2017   GAD (generalized anxiety disorder) 04/22/2013   Central auditory processing disorder (CAPD) 04/22/2013   Nexplanon insertion 03/16/2013   Contraception 03/16/2013    Past Surgical History:  Procedure Laterality Date   ANTERIOR CRUCIATE LIGAMENT REPAIR Right  06/04/2017   Procedure: Right knee arthroscopic hamsting anterior cruciate ligament reconstruction with partial  medial menisectomy;  Surgeon: Yolonda Kida, MD;  Location: [facility];  Service: Orthopedics;  Laterality: Right;   TONSILLECTOMY      OB History     Gravida  0   Para  0   Term  0   Preterm  0   AB  0   Living  0      SAB  0   IAB  0   Ectopic  0   Multiple  0   Live Births               Home Medications    Prior to Admission medications   Medication Sig Start Date End Date Taking? Authorizing Provider  azelastine (ASTELIN) 0.1 % nasal spray Place 1 spray into both nostrils 2 (two) times daily as needed. Use in each nostril as directed 04/18/23  Yes Birder Robson, MD  cetirizine (ZYRTEC ALLERGY) 10 MG tablet Take 1 tablet (10 mg total) by mouth daily. 04/18/23  Yes Birder Robson, MD  EPINEPHrine (EPIPEN 2-PAK) 0.3 mg/0.3 mL IJ SOAJ injection Inject 0.3 mg into the muscle as needed for anaphylaxis. 04/18/23  Yes Birder Robson, MD  escitalopram (LEXAPRO) 10 MG tablet Take 15 mg by mouth at bedtime. 04/04/23  Yes [provider]  fluticasone (FLONASE) 50 MCG/ACT nasal spray Place 2 sprays into both nostrils daily. 04/15/23  Yes Birder Robson, MD  hydrOXYzine (ATARAX) 25 MG tablet Take 25-50 mg by mouth at bedtime as needed. 12/27/22  Yes [provider]  Lisdexamfetamine Dimesylate 40 MG CHEW Chew 1 tablet by mouth every morning. 04/05/23  Yes [provider]  oxcarbazepine (TRILEPTAL) 600 MG tablet Take 600 mg by mouth 2 (two) times daily.   Yes [provider]  topiramate (TOPAMAX) 200 MG tablet Take 200 mg by mouth at bedtime. 03/27/23  Yes [provider]  traZODone (DESYREL) 150 MG tablet Take 1 tablet (150 mg total) by mouth at bedtime as needed for sleep. 04/12/21  Yes White, Chrystine Oiler, NP    Family History Family History  Problem Relation Age of Onset   Hypertension Mother     Migraines Mother    Anxiety disorder Mother    ADD / ADHD Mother    Depression Mother    Bipolar disorder Mother    Seizures Neg Hx    Autism Neg Hx    Schizophrenia Neg Hx     Social History Social History   Tobacco Use   Smoking status: Never    Passive exposure: Never   Smokeless tobacco: Never  Vaping Use   Vaping status: Never Used  Substance Use Topics   Alcohol use: No   Drug use: No     Allergies   Hydrocodone   Review of Systems Review of Systems Per HPI  Physical Exam Triage Vital Signs ED Triage Vitals  Encounter Vitals Group     BP 05/13/23 1218 137/83     Systolic BP Percentile --      Diastolic BP Percentile --      Pulse Rate 05/13/23 1218 92     Resp 05/13/23 1218 17     Temp 05/13/23 1218 98.3 F (36.8 C)     Temp Source 05/13/23 1218 Oral     SpO2 05/13/23 1218 96 %     Weight 05/13/23 1217 (!) 302 lb (137 kg)     Height 05/13/23 1217 5\' 4"  (1.626 m)     Head Circumference --      Peak Flow --      Pain Score 05/13/23 1217 0     Pain Loc --      Pain Education --      Exclude from Growth Chart --    No data found.  Updated Vital Signs BP 137/83 (BP Location: Left Arm)   Pulse 92   Temp 98.3 F (36.8 C) (Oral)   Resp 17   Ht 5\' 4"  (1.626 m)   Wt (!) 302 lb (137 kg)   LMP 05/10/2023   SpO2 96%   BMI 51.84 kg/m   Visual Acuity Right Eye Distance:   Left Eye Distance:   Bilateral Distance:    Right Eye Near:   Left Eye Near:    Bilateral Near:     Physical Exam Constitutional:      General: She is not in acute distress.    Appearance: Normal appearance. She is not toxic-appearing or diaphoretic.  HENT:     Head: Normocephalic and atraumatic.     Mouth/Throat:     Mouth: Mucous membranes are moist.     Pharynx: No posterior oropharyngeal erythema.  Eyes:     Extraocular Movements: Extraocular movements intact.     Conjunctiva/sclera: Conjunctivae normal.  Cardiovascular:     Rate  and Rhythm: Normal rate and  regular rhythm.     Pulses: Normal pulses.     Heart sounds: Normal heart sounds.  Pulmonary:     Effort: Pulmonary effort is normal. No respiratory distress.     Breath sounds: Normal breath sounds.  Abdominal:     General: Bowel sounds are normal. There is no distension.     Palpations: Abdomen is soft.     Tenderness: There is no abdominal tenderness.  Genitourinary:    Comments: Deferred with shared decision making. Self swab performed.  Neurological:     General: No focal deficit present.     Mental Status: She is alert and oriented to person, place, and time. Mental status is at baseline.  Psychiatric:        Mood and Affect: Mood normal.        Behavior: Behavior normal.        Thought Content: Thought content normal.        Judgment: Judgment normal.      UC Treatments / Results  Labs (all labs ordered are listed, but only abnormal results are displayed) Labs Reviewed  POCT URINE PREGNANCY - Normal  HIV ANTIBODY (ROUTINE TESTING W REFLEX)  RPR  CERVICOVAGINAL ANCILLARY ONLY    EKG   Radiology No results found.  Procedures Procedures (including critical care time)  Medications Ordered in UC Medications - No data to display  Initial Impression / Assessment and Plan / UC Course  I have reviewed the triage vital signs and the nursing notes.  Pertinent labs & imaging results that were available during my care of the patient were reviewed by me and considered in my medical decision making (see chart for details).     Urine pregnancy test negative.  Cervicovaginal swab, HIV, RPR test pending.  Given no vaginal discharge or irritation, BV and yeast testing was deferred.  Patient to follow-up with gynecologist if menstrual cycles continue to be abnormal.  Patient reported vomiting to ensure that it is not related to STD.  Patient states that it started after she took her vitamins on empty stomach so this is most likely attributed to symptoms.  No signs of acute  abdomen or dehydration on exam and vomiting has now resolved.  Advised strict follow-up precautions for all chief complaints today.  Patient verbalized understanding and was agreeable with plan. Final Clinical Impressions(s) / UC Diagnoses   Final diagnoses:  Screening examination for venereal disease  Urine pregnancy test negative     Discharge Instructions      Your pregnancy test was negative.  STD testing is pending.  Will call if anything is positive.    ED Prescriptions   None    PDMP not reviewed this encounter.   Gustavus Bryant, [place] 05/13/23 1255

## 2023-05-13 NOTE — Discharge Instructions (Signed)
Your pregnancy test was negative.  STD testing is pending.  Will call if anything is positive.

## 2023-05-13 NOTE — ED Triage Notes (Signed)
Pt states that she would like to be checked for std. Pt denies any symptoms. Pt states that she also has some vomiting. Pt states that it started 2 weeks go. Pt states that it comes and goes.

## 2023-05-14 ENCOUNTER — Ambulatory Visit: Payer: Self-pay

## 2023-05-14 LAB — HIV ANTIBODY (ROUTINE TESTING W REFLEX): HIV Screen 4th Generation wRfx: NONREACTIVE

## 2023-05-14 LAB — CERVICOVAGINAL ANCILLARY ONLY
Chlamydia: NEGATIVE
Comment: NEGATIVE
Comment: NEGATIVE
Comment: NORMAL
Neisseria Gonorrhea: NEGATIVE
Trichomonas: NEGATIVE

## 2023-05-14 LAB — RPR: RPR Ser Ql: NONREACTIVE

## 2023-05-19 ENCOUNTER — Ambulatory Visit (INDEPENDENT_AMBULATORY_CARE_PROVIDER_SITE_OTHER): Payer: Medicare Other | Admitting: Family

## 2023-05-19 ENCOUNTER — Encounter: Payer: Self-pay | Admitting: Family

## 2023-05-19 VITALS — BP 132/89 | HR 94 | Temp 99.5°F | Resp 18 | Ht 64.5 in | Wt 306.4 lb

## 2023-05-19 DIAGNOSIS — Z130 Encounter for screening for diseases of the blood and blood-forming organs and certain disorders involving the immune mechanism: Secondary | ICD-10-CM

## 2023-05-19 DIAGNOSIS — E6609 Other obesity due to excess calories: Secondary | ICD-10-CM

## 2023-05-19 DIAGNOSIS — Z0000 Encounter for general adult medical examination without abnormal findings: Secondary | ICD-10-CM

## 2023-05-19 DIAGNOSIS — Z1159 Encounter for screening for other viral diseases: Secondary | ICD-10-CM

## 2023-05-19 DIAGNOSIS — Z13228 Encounter for screening for other metabolic disorders: Secondary | ICD-10-CM

## 2023-05-19 DIAGNOSIS — Z124 Encounter for screening for malignant neoplasm of cervix: Secondary | ICD-10-CM

## 2023-05-19 DIAGNOSIS — Z13220 Encounter for screening for lipoid disorders: Secondary | ICD-10-CM

## 2023-05-19 DIAGNOSIS — Z131 Encounter for screening for diabetes mellitus: Secondary | ICD-10-CM

## 2023-05-19 DIAGNOSIS — Z6841 Body Mass Index (BMI) 40.0 and over, adult: Secondary | ICD-10-CM

## 2023-05-19 DIAGNOSIS — E66813 Obesity, class 3: Secondary | ICD-10-CM

## 2023-05-19 DIAGNOSIS — Z1329 Encounter for screening for other suspected endocrine disorder: Secondary | ICD-10-CM

## 2023-05-19 NOTE — Progress Notes (Signed)
Patient ID: Misty Moses, female    DOB: 08/03/2000  MRN: [id number]  CC: Annual Exam  Subjective: Misty Moses is a 22 y.o. female who presents for annual exam.   Her concerns today include:  None.  Patient Active Problem List   Diagnosis Date Noted   MDD (major depressive disorder), recurrent severe, without psychosis (HCC) 12/02/2017   Gait disturbance 10/14/2017   Weakness 10/14/2017   Nonintractable episodic headache 07/16/2017   New ACL tear, right, initial encounter 06/04/2017   Acute medial meniscus tear of right knee 06/04/2017   GAD (generalized anxiety disorder) 04/22/2013   Central auditory processing disorder (CAPD) 04/22/2013   Nexplanon insertion 03/16/2013   Contraception 03/16/2013     Current Outpatient Medications on File Prior to Visit  Medication Sig Dispense Refill   azelastine (ASTELIN) 0.1 % nasal spray Place 1 spray into both nostrils 2 (two) times daily as needed. Use in each nostril as directed 30 mL 5   cetirizine (ZYRTEC ALLERGY) 10 MG tablet Take 1 tablet (10 mg total) by mouth daily. 30 tablet 5   EPINEPHrine (EPIPEN 2-PAK) 0.3 mg/0.3 mL IJ SOAJ injection Inject 0.3 mg into the muscle as needed for anaphylaxis. 2 each 1   escitalopram (LEXAPRO) 10 MG tablet Take 15 mg by mouth at bedtime.     fluticasone (FLONASE) 50 MCG/ACT nasal spray Place 2 sprays into both nostrils daily. 16 g 5   hydrOXYzine (ATARAX) 25 MG tablet Take 25-50 mg by mouth at bedtime as needed.     Lisdexamfetamine Dimesylate 40 MG CHEW Chew 1 tablet by mouth every morning.     oxcarbazepine (TRILEPTAL) 600 MG tablet Take 600 mg by mouth 2 (two) times daily.     topiramate (TOPAMAX) 200 MG tablet Take 200 mg by mouth at bedtime.     traZODone (DESYREL) 150 MG tablet Take 1 tablet (150 mg total) by mouth at bedtime as needed for sleep. 7 tablet 0   No current facility-administered medications on file prior to visit.    Allergies  Allergen Reactions   Hydrocodone  Other (See Comments)    Dizziness    Social History   Socioeconomic History   Marital status: Single    Spouse name: Not on file   Number of children: Not on file   Years of education: Not on file   Highest education level: Not on file  Occupational History   Occupation: Oncologist  Tobacco Use   Smoking status: Never    Passive exposure: Never   Smokeless tobacco: Never  Vaping Use   Vaping status: Never Used  Substance and Sexual Activity   Alcohol use: No   Drug use: No   Sexual activity: Not Currently    Birth control/protection: Implant, Condom    Comment: pt. reports she is sexually active with boyfriend and he uses condoms  Other Topics Concern   Not on file  Social History Narrative   LIVES WITH MOTHER AND BROTHER   IN 10TH GRADE at [employer]    NORMAL BIRTH HISTORY   She enjoys listening to music, talking, and watching videos   Social Determinants of Health   Financial Resource Strain: Low Risk  (05/19/2023)   Overall Financial Resource Strain (CARDIA)    Difficulty of Paying Living Expenses: Not hard at all  Food Insecurity: No Food Insecurity (05/19/2023)   Hunger Vital Sign    Worried About Running Out of Food in the Last Year: Never true  Ran Out of Food in the Last Year: Never true  Transportation Needs: No Transportation Needs (05/19/2023)   PRAPARE - Administrator, Civil Service (Medical): No    Lack of Transportation (Non-Medical): No  Physical Activity: Insufficiently Active (05/19/2023)   Exercise Vital Sign    Days of Exercise per Week: 2 days    Minutes of Exercise per Session: 20 min  Stress: No Stress Concern Present (05/19/2023)   [employer] of Occupational Health - Occupational Stress Questionnaire    Feeling of Stress : Not at all  Social Connections: Socially Isolated (05/19/2023)   Social Connection and Isolation Panel [NHANES]    Frequency of Communication with Friends and Family: More than three  times a week    Frequency of Social Gatherings with Friends and Family: More than three times a week    Attends Religious Services: Never    Database administrator or Organizations: No    Attends Banker Meetings: Never    Marital Status: Never married  Intimate Partner Violence: Not At Risk (05/19/2023)   Humiliation, Afraid, Rape, and Kick questionnaire    Fear of Current or Ex-Partner: No    Emotionally Abused: No    Physically Abused: No    Sexually Abused: No    Family History  Problem Relation Age of Onset   Hypertension Mother    Migraines Mother    Anxiety disorder Mother    ADD / ADHD Mother    Depression Mother    Bipolar disorder Mother    Seizures Neg Hx    Autism Neg Hx    Schizophrenia Neg Hx     Past Surgical History:  Procedure Laterality Date   ANTERIOR CRUCIATE LIGAMENT REPAIR Right 06/04/2017   Procedure: Right knee arthroscopic hamsting anterior cruciate ligament reconstruction with partial  medial menisectomy;  Surgeon: Yolonda Kida, MD;  Location: [facility];  Service: Orthopedics;  Laterality: Right;   TONSILLECTOMY      ROS: Review of Systems Negative except as stated above  PHYSICAL EXAM: BP 132/89   Pulse 94   Temp 99.5 F (37.5 C) (Oral)   Resp 18   Ht 5' 4.5" (1.638 m)   Wt (!) 306 lb 6.4 oz (139 kg)   LMP 05/10/2023   SpO2 98%   BMI 51.78 kg/m   Physical Exam HENT:     Head: Normocephalic and atraumatic.     Right Ear: Tympanic membrane, ear canal and external ear normal.     Left Ear: Tympanic membrane, ear canal and external ear normal.     Nose: Nose normal.     Mouth/Throat:     Mouth: Mucous membranes are moist.     Pharynx: Oropharynx is clear.  Eyes:     Extraocular Movements: Extraocular movements intact.     Conjunctiva/sclera: Conjunctivae normal.     Pupils: Pupils are equal, round, and reactive to light.  Neck:     Thyroid: No thyroid mass, thyromegaly or thyroid tenderness.   Cardiovascular:     Rate and Rhythm: Normal rate and regular rhythm.     Pulses: Normal pulses.     Heart sounds: Normal heart sounds.  Pulmonary:     Effort: Pulmonary effort is normal.     Breath sounds: Normal breath sounds.  Chest:     Comments: Patient declined.  Abdominal:     General: Bowel sounds are normal.     Palpations: Abdomen is soft.  Genitourinary:  Comments: Patient declined.  Musculoskeletal:        General: Normal range of motion.     Right shoulder: Normal.     Left shoulder: Normal.     Right upper arm: Normal.     Left upper arm: Normal.     Right elbow: Normal.     Left elbow: Normal.     Right forearm: Normal.     Left forearm: Normal.     Right wrist: Normal.     Left wrist: Normal.     Right hand: Normal.     Left hand: Normal.     Cervical back: Normal, normal range of motion and neck supple.     Thoracic back: Normal.     Lumbar back: Normal.     Right hip: Normal.     Left hip: Normal.     Right upper leg: Normal.     Left upper leg: Normal.     Right knee: Normal.     Left knee: Normal.     Right lower leg: Normal.     Left lower leg: Normal.     Right ankle: Normal.     Left ankle: Normal.     Right foot: Normal.     Left foot: Normal.  Skin:    General: Skin is warm and dry.     Capillary Refill: Capillary refill takes less than 2 seconds.  Neurological:     General: No focal deficit present.     Mental Status: She is alert and oriented to person, place, and time.  Psychiatric:        Mood and Affect: Mood normal.        Behavior: Behavior normal.     ASSESSMENT AND PLAN: 1. Annual physical exam - Counseled on 150 minutes of exercise per week as tolerated, healthy eating (including decreased daily intake of saturated fats, cholesterol, added sugars, sodium), STI prevention, and routine healthcare maintenance.  2. Screening for metabolic disorder - Routine screening.  - CMP14+EGFR  3. Screening for deficiency anemia -  Routine screening.  - CBC  4. Diabetes mellitus screening - Routine screening.  - Hemoglobin A1c  5. Screening cholesterol level - Routine screening.  - Lipid panel  6. Thyroid disorder screen - Routine screening.  - TSH  7. Need for hepatitis C screening test - Routine screening.  - Hepatitis C Antibody  8. Pap smear for cervical cancer screening - Referral to Gynecology for evaluation/management. - Ambulatory referral to Gynecology    Patient was given the opportunity to ask questions.  Patient verbalized understanding of the plan and was able to repeat key elements of the plan. Patient was given clear instructions to go to Emergency Department or return to medical center if symptoms don't improve, worsen, or new problems develop.The patient verbalized understanding.   Orders Placed This Encounter  Procedures   Hepatitis C Antibody   CBC   Lipid panel   CMP14+EGFR   Hemoglobin A1c   TSH   Ambulatory referral to Gynecology     Return in about 1 year (around 05/18/2024) for Physical per patient preference.  Rema Fendt, NP

## 2023-05-20 ENCOUNTER — Other Ambulatory Visit: Payer: Self-pay | Admitting: Family

## 2023-05-20 DIAGNOSIS — D649 Anemia, unspecified: Secondary | ICD-10-CM | POA: Insufficient documentation

## 2023-05-20 LAB — CMP14+EGFR
ALT: 11 [IU]/L (ref 0–32)
AST: 14 [IU]/L (ref 0–40)
Albumin: 4.5 g/dL (ref 4.0–5.0)
Alkaline Phosphatase: 97 [IU]/L (ref 44–121)
BUN/Creatinine Ratio: 9 (ref 9–23)
BUN: 10 mg/dL (ref 6–20)
Bilirubin Total: <0.2 mg/dL (ref 0.0–1.2)
CO2: 19 mmol/L — ABNORMAL LOW (ref 20–29)
Calcium: 9.7 mg/dL (ref 8.7–10.2)
Chloride: 101 mmol/L (ref 96–106)
Creatinine, Ser: 1.1 mg/dL — ABNORMAL HIGH (ref 0.57–1.00)
Globulin, Total: 3.3 g/dL (ref 1.5–4.5)
Glucose: 79 mg/dL (ref 70–99)
Potassium: 4 mmol/L (ref 3.5–5.2)
Sodium: 138 mmol/L (ref 134–144)
Total Protein: 7.8 g/dL (ref 6.0–8.5)
eGFR: 72 mL/min/{1.73_m2} (ref >59)

## 2023-05-20 LAB — CBC
Hematocrit: 35.6 % (ref 34.0–46.6)
Hemoglobin: 10.5 g/dL — ABNORMAL LOW (ref 11.1–15.9)
MCH: 21.1 pg — ABNORMAL LOW (ref 26.6–33.0)
MCHC: 29.5 g/dL — ABNORMAL LOW (ref 31.5–35.7)
MCV: 72 fL — ABNORMAL LOW (ref 79–97)
Platelets: 438 10*3/uL (ref 150–450)
RBC: 4.98 x10E6/uL (ref 3.77–5.28)
RDW: 16.3 % — ABNORMAL HIGH (ref 11.7–15.4)
WBC: 14.2 10*3/uL — ABNORMAL HIGH (ref 3.4–10.8)

## 2023-05-20 LAB — HEPATITIS C ANTIBODY: Hep C Virus Ab: NONREACTIVE

## 2023-05-20 LAB — HEMOGLOBIN A1C
Est. average glucose Bld gHb Est-mCnc: 111 mg/dL
Hgb A1c MFr Bld: 5.5 % (ref 4.8–5.6)

## 2023-05-20 LAB — LIPID PANEL
Chol/HDL Ratio: 2.7 ratio (ref 0.0–4.4)
Cholesterol, Total: 175 mg/dL (ref 100–199)
HDL: 64 mg/dL (ref >39)
LDL Chol Calc (NIH): 98 mg/dL (ref 0–99)
Triglycerides: 68 mg/dL (ref 0–149)
VLDL Cholesterol Cal: 13 mg/dL (ref 5–40)

## 2023-05-20 LAB — TSH: TSH: 0.985 u[IU]/mL (ref 0.450–4.500)

## 2023-05-20 MED ORDER — IRON (FERROUS SULFATE) 325 (65 FE) MG PO TABS: 325.0000 mg | ORAL_TABLET | Freq: Every day | ORAL | 0 refills | Status: DC

## 2023-05-21 ENCOUNTER — Ambulatory Visit (HOSPITAL_BASED_OUTPATIENT_CLINIC_OR_DEPARTMENT_OTHER): Payer: Medicare Other | Admitting: Student

## 2023-05-21 DIAGNOSIS — F411 Generalized anxiety disorder: Secondary | ICD-10-CM

## 2023-05-21 DIAGNOSIS — F331 Major depressive disorder, recurrent, moderate: Secondary | ICD-10-CM | POA: Diagnosis not present

## 2023-05-21 DIAGNOSIS — Z8659 Personal history of other mental and behavioral disorders: Secondary | ICD-10-CM | POA: Diagnosis not present
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
## 2023-05-23 NOTE — Plan of Care (Signed)
 [facility] Tonsillectomy/Adenoidectomy, Postoperative PEDS care plan entered in error.

## 2023-05-27 MED ORDER — ESCITALOPRAM OXALATE 10 MG PO TABS: 15.0000 mg | ORAL_TABLET | Freq: Every day | ORAL | 1 refills | Status: DC

## 2023-05-27 MED ORDER — OXCARBAZEPINE 600 MG PO TABS: 600.0000 mg | ORAL_TABLET | Freq: Two times a day (BID) | ORAL | 1 refills | Status: DC

## 2023-05-27 MED ORDER — TRAZODONE HCL 150 MG PO TABS: 150.0000 mg | ORAL_TABLET | Freq: Every evening | ORAL | 1 refills | Status: DC | PRN

## 2023-05-27 MED ORDER — LISDEXAMFETAMINE DIMESYLATE 40 MG PO CHEW: 1.0000 | CHEWABLE_TABLET | Freq: Every morning | ORAL | 0 refills | Status: DC

## 2023-05-27 MED ORDER — TOPIRAMATE 200 MG PO TABS: 200.0000 mg | ORAL_TABLET | Freq: Every day | ORAL | 1 refills | Status: DC

## 2023-05-28 NOTE — Addendum Note (Signed)
Addended by: Everlena Cooper on: 05/28/2023 02:02 PM   Modules accepted: Level of Service

## 2023-05-30 ENCOUNTER — Ambulatory Visit: Payer: Medicare Other | Admitting: Internal Medicine

## 2023-06-03 ENCOUNTER — Other Ambulatory Visit: Payer: Self-pay

## 2023-06-03 ENCOUNTER — Encounter: Payer: Self-pay | Admitting: Internal Medicine

## 2023-06-03 ENCOUNTER — Ambulatory Visit (INDEPENDENT_AMBULATORY_CARE_PROVIDER_SITE_OTHER): Payer: Medicare Other | Admitting: Internal Medicine

## 2023-06-03 VITALS — BP 128/74 | HR 88 | Temp 98.0°F | Resp 16 | Ht 64.5 in | Wt 310.2 lb

## 2023-06-03 DIAGNOSIS — J302 Other seasonal allergic rhinitis: Secondary | ICD-10-CM | POA: Diagnosis not present

## 2023-06-03 DIAGNOSIS — J3089 Other allergic rhinitis: Secondary | ICD-10-CM | POA: Diagnosis not present

## 2023-06-03 DIAGNOSIS — T7805XD Anaphylactic reaction due to tree nuts and seeds, subsequent encounter: Secondary | ICD-10-CM | POA: Diagnosis not present

## 2023-06-03 MED ORDER — AZELASTINE HCL 0.1 % NA SOLN: 1.0000 | Freq: Two times a day (BID) | NASAL | 5 refills | Status: DC | PRN

## 2023-06-03 MED ORDER — FLUTICASONE PROPIONATE 50 MCG/ACT NA SUSP: 2.0000 | Freq: Every day | NASAL | 5 refills | Status: DC

## 2023-06-03 MED ORDER — EPINEPHRINE 0.3 MG/0.3ML IJ SOAJ: 0.3000 mg | INTRAMUSCULAR | 1 refills | Status: AC | PRN

## 2023-06-03 MED ORDER — CETIRIZINE HCL 10 MG PO TABS: 10.0000 mg | ORAL_TABLET | Freq: Every day | ORAL | 5 refills | Status: DC

## 2023-06-03 NOTE — Progress Notes (Signed)
FOLLOW UP Date of Service/Encounter:  06/03/23   Subjective:  Misty Moses (DOB: 05-30-2001) is a 22 y.o. female who [facility] for follow up for allergic rhinitis and food allergies.   History obtained from: chart review and patient. Last visit was on 04/18/2023 and at the time was SPT positive to multiple environmental allergens and treenuts. Discussed avoidance of treenuts and keep Epipen. Also started on Flonase, Azelastine, Zyrtec.  Her allergies are doing better since last visit.  She has not had much trouble with congestion, runny nose, sneezing, itchy eyes.  Taking Zyrtec daily which does help.  Has not needed the nose sprays much.   In terms of food allergies, she avoids treenuts.  She did drink Misty alcoholic coconut drink and did not have any issues with coconut.  Has Misty Epipen.    Past Medical History: Past Medical History:  Diagnosis Date   ADD (attention deficit disorder with hyperactivity)    Anxiety    Astigmatism    Back pain    Bipolar 1 disorder (HCC)    Borderline diabetes    TAKES METFORMIN AS PRECAUTION   Constipation    Depression    Excessive anger    EXPLOSIVE ANGER MANAGEMENT ISSUES   High blood pressure    Lactose intolerance     Objective:  BP 128/74 (BP Location: Right Arm, Patient Position: Sitting, Cuff Size: Large)   Pulse 88   Temp 98 F (36.7 C) (Temporal)   Resp 16   Ht 5' 4.5" (1.638 m)   Wt (!) 310 lb 3.2 oz (140.7 kg)   LMP 05/10/2023   SpO2 99%   BMI 52.42 kg/m  Body mass index is 52.42 kg/m. Physical Exam: GEN: alert, well developed HEENT: clear conjunctiva, nose with mild inferior turbinate hypertrophy, pink nasal mucosa, no rhinorrhea, no cobblestoning HEART: regular rate and rhythm, no murmur LUNGS: clear to auscultation bilaterally, no coughing, unlabored respiration SKIN: no rashes or lesions   Assessment:   1. Seasonal and perennial allergic rhinitis   2.  Anaphylaxis due to tree nut, subsequent encounter     Plan/Recommendations:  Allergic Rhinitis:- - Well controlled  - Positive skin test 03/2023: trees, grasses, weeds, molds  - Avoidance measures discussed. - Use nasal saline rinses before nose sprays such as with Neilmed Sinus Rinse.  Use distilled water.   - If symptoms worsen, use Flonase 2 sprays each nostril daily. Aim upward and outward. - Use Azelastine 1-2 sprays each nostril twice daily as needed for runny nose, drainage, sneezing, congestion. Aim upward and outward. - Use Zyrtec 10 mg daily as needed for runny nose, sneezing, itchy watery eyes.  - Consider allergy shots as long term control of your symptoms by teaching your immune system to be more tolerant of your allergy triggers   Food allergy:  - please strictly avoid treenuts. Okay to eat coconut.   - SPT 03/2023 positive to cashew, almond, hazelnut, pecan, pistachio. Negative to walnut and [place] nut.  - Initial rxn: sensation of throat itching, swelling, trouble talking  - for SKIN only reaction, okay to take Benadryl 25mg  capsules every 6 hours as needed - for SKIN + ANY additional symptoms, OR IF concern for LIFE THREATENING reaction = Epipen Autoinjector EpiPen 0.3 mg. - If using Epinephrine autoinjector, call 911 or go to the ER.        Return in about 4 months (around 10/01/2023).  Alesia Morin, MD [facility]

## 2023-06-03 NOTE — Patient Instructions (Addendum)
Allergic Rhinitis: - Positive skin test 03/2023: trees, grasses, weeds, molds  - Avoidance measures discussed. - Use nasal saline rinses before nose sprays such as with Neilmed Sinus Rinse.  Use distilled water.   - Use Flonase 2 sprays each nostril daily. Aim upward and outward. - Use Azelastine 1-2 sprays each nostril twice daily as needed for runny nose, drainage, sneezing, congestion. Aim upward and outward. - Use Zyrtec 10 mg daily as needed for runny nose, sneezing, itchy watery eyes.  - Consider allergy shots as long term control of your symptoms by teaching your immune system to be more tolerant of your allergy triggers   Food allergy:  - please strictly avoid treenuts. Okay to eat coconut.   - for SKIN only reaction, okay to take Benadryl 25mg  capsules every 6 hours as needed - for SKIN + ANY additional symptoms, OR IF concern for LIFE THREATENING reaction = Epipen Autoinjector EpiPen 0.3 mg. - If using Epinephrine autoinjector, call 911 or go to the ER.

## 2023-06-10 ENCOUNTER — Telehealth (HOSPITAL_COMMUNITY): Payer: Self-pay | Admitting: *Deleted

## 2023-06-10 NOTE — Telephone Encounter (Signed)
Pt called stating that she needs to speak with you regarding a form from her previous provider. She may be reached @ [phone]. Thank you.

## 2023-06-12 NOTE — Telephone Encounter (Signed)
Called patient; she needs a release of information to be able to have paperwork sent to our office. Will reach out to front desk.

## 2023-06-21 ENCOUNTER — Ambulatory Visit
Admission: EM | Admit: 2023-06-21 | Discharge: 2023-06-21 | Disposition: A | Discharge status: Home / Self Care | Payer: Medicare Other | Attending: Physician Assistant | Admitting: Physician Assistant

## 2023-06-21 DIAGNOSIS — Z23 Encounter for immunization: Secondary | ICD-10-CM

## 2023-06-21 DIAGNOSIS — S61212A Laceration without foreign body of right middle finger without damage to nail, initial encounter: Secondary | ICD-10-CM

## 2023-06-21 MED ORDER — TETANUS-DIPHTH-ACELL PERTUSSIS 5-2.5-18.5 LF-MCG/0.5 IM SUSY
0.5000 mL | PREFILLED_SYRINGE | Freq: Once | INTRAMUSCULAR | Status: AC
  Administered 2023-06-21: 0.5 mL via INTRAMUSCULAR

## 2023-06-21 NOTE — ED Provider Notes (Signed)
[facility]    CSN: [id number] Arrival date & time: 06/21/23  1521      History   Chief Complaint Chief Complaint  Patient presents with   Hand Pain    Finger    HPI Misty [facility] is a 22 y.o. female.    Hand Pain Pertinent negatives include no abdominal pain.    Past Medical History:  Diagnosis Date   ADD (attention deficit disorder with hyperactivity)    Anxiety    Astigmatism    Back pain    Bipolar 1 disorder (HCC)    Borderline diabetes    TAKES METFORMIN AS PRECAUTION   Constipation    Depression    Excessive anger    EXPLOSIVE ANGER MANAGEMENT ISSUES   High blood pressure    Lactose intolerance     Patient Active Problem List   Diagnosis Date Noted   Anemia 05/20/2023   History of eustachian tube dysfunction 05/04/2020   Abnormal auditory perception of both ears 07/31/2018   Excessive cerumen in both ear canals 06/11/2018   Gait disturbance 10/14/2017   Weakness 10/14/2017   Nonintractable episodic headache 07/16/2017   New ACL tear, right, initial encounter 06/04/2017   Acute medial meniscus tear of right knee 06/04/2017   Cognitive developmental delay 05/19/2017   Callus of foot 03/15/2016   Gastroesophageal reflux disease without esophagitis 09/20/2015   Iron deficiency anemia secondary to inadequate dietary iron intake 09/20/2015   Morbid obesity (HCC) 08/25/2015   Generalized anxiety disorder 04/22/2013   Auditory processing disorder 04/22/2013   Severe episode of recurrent major depressive disorder, without psychotic features (HCC) 04/22/2013   Nexplanon insertion 03/16/2013   Contraception 03/16/2013    Past Surgical History:  Procedure Laterality Date   ANTERIOR CRUCIATE LIGAMENT REPAIR Right 06/04/2017   Procedure: Right knee arthroscopic hamsting anterior cruciate ligament reconstruction with partial  medial menisectomy;  Surgeon: Yolonda Kida, MD;  Location: [facility];  Service:  Orthopedics;  Laterality: Right;   TONSILLECTOMY      OB History     Gravida  0   Para  0   Term  0   Preterm  0   AB  0   Living  0      SAB  0   IAB  0   Ectopic  0   Multiple  0   Live Births               Home Medications    Prior to Admission medications   Medication Sig Start Date End Date Taking? Authorizing Provider  escitalopram (LEXAPRO) 10 MG tablet Take 1.5 tablets (15 mg total) by mouth at bedtime. 05/27/23 07/26/23 Yes Cosby, Toni Amend, MD  azelastine (ASTELIN) 0.1 % nasal spray Place 1 spray into both nostrils 2 (two) times daily as needed. Use in each nostril as directed 06/03/23   Birder Robson, MD  cetirizine (ZYRTEC ALLERGY) 10 MG tablet Take 1 tablet (10 mg total) by mouth daily. 06/03/23   Birder Robson, MD  EPINEPHrine (EPIPEN 2-PAK) 0.3 mg/0.3 mL IJ SOAJ injection Inject 0.3 mg into the muscle as needed for anaphylaxis. 06/03/23   Birder Robson, MD  fluticasone (FLONASE) 50 MCG/ACT nasal spray Place 2 sprays into both nostrils daily. 06/03/23   Birder Robson, MD  hydrOXYzine (ATARAX) 25 MG tablet Take 25-50 mg by mouth at bedtime as needed. 12/27/22   [provider]  hydrOXYzine (VISTARIL) 50 MG capsule Take 50-100  mg by mouth at bedtime as needed. 05/29/23   [provider]  Iron, Ferrous Sulfate, 325 (65 Fe) MG TABS Take 325 mg by mouth daily. 05/20/23   Rema Fendt, NP  Lisdexamfetamine Dimesylate 40 MG CHEW Chew 1 tablet (40 mg total) by mouth every morning. 05/27/23   Lamar Sprinkles, MD  oxcarbazepine (TRILEPTAL) 600 MG tablet Take 1 tablet (600 mg total) by mouth 2 (two) times daily. 05/27/23 07/26/23  Lamar Sprinkles, MD  topiramate (TOPAMAX) 200 MG tablet Take 1 tablet (200 mg total) by mouth at bedtime. 05/27/23 07/26/23  Lamar Sprinkles, MD  traZODone (DESYREL) 150 MG tablet Take 1 tablet (150 mg total) by mouth at bedtime as needed for sleep. 05/27/23 07/26/23  Lamar Sprinkles, MD    Family History Family  History  Problem Relation Age of Onset   Hypertension Mother    Migraines Mother    Anxiety disorder Mother    ADD / ADHD Mother    Depression Mother    Bipolar disorder Mother    Seizures Neg Hx    Autism Neg Hx    Schizophrenia Neg Hx     Social History Social History   Tobacco Use   Smoking status: Never    Passive exposure: Never   Smokeless tobacco: Never  Vaping Use   Vaping status: Never Used  Substance Use Topics   Alcohol use: No   Drug use: No     Allergies   Hydrocodone   Review of Systems Review of Systems  Constitutional:  Negative for chills and fever.  Eyes:  Negative for discharge and redness.  Gastrointestinal:  Negative for abdominal pain, nausea and vomiting.  Skin:  Positive for wound. Negative for color change.  Neurological:  Negative for numbness.     Physical Exam Triage Vital Signs ED Triage Vitals  Encounter Vitals Group     BP 06/21/23 1529 131/82     Systolic BP Percentile --      Diastolic BP Percentile --      Pulse Rate 06/21/23 1529 82     Resp 06/21/23 1529 18     Temp 06/21/23 1529 98.4 F (36.9 C)     Temp Source 06/21/23 1529 Oral     SpO2 06/21/23 1529 99 %     Weight 06/21/23 1528 (!) 310 lb 4.4 oz (140.7 kg)     Height 06/21/23 1528 5' 4.5" (1.638 m)     Head Circumference --      Peak Flow --      Pain Score 06/21/23 1528 2     Pain Loc --      Pain Education --      Exclude from Growth Chart --    No data found.  Updated Vital Signs BP 131/82 (BP Location: Left Arm)   Pulse 82   Temp 98.4 F (36.9 C) (Oral)   Resp 18   Ht 5' 4.5" (1.638 m)   Wt (!) 310 lb 4.4 oz (140.7 kg)   LMP 06/20/2023 (Exact Date)   SpO2 99%   BMI 52.44 kg/m   Visual Acuity Right Eye Distance:   Left Eye Distance:   Bilateral Distance:    Right Eye Near:   Left Eye Near:    Bilateral Near:     Physical Exam Vitals and nursing note reviewed.  Constitutional:      General: She is not in acute distress.     Appearance: Normal appearance. She is not ill-appearing.  HENT:  Head: Normocephalic and atraumatic.  Eyes:     Conjunctiva/sclera: Conjunctivae normal.  Cardiovascular:     Rate and Rhythm: Normal rate.  Pulmonary:     Effort: Pulmonary effort is normal. No respiratory distress.  Skin:    Capillary Refill: Normal cap refill to right middle finger    Comments: Approx 2 cm superficial laceration without bleeding to distal right middle finger pad- dermabond used to seal  Neurological:     Mental Status: She is alert.     Comments: Sensation intact to distal right middle finger  Psychiatric:        Mood and Affect: Mood normal.        Behavior: Behavior normal.        Thought Content: Thought content normal.      UC Treatments / Results  Labs (all labs ordered are listed, but only abnormal results are displayed) Labs Reviewed - No data to display  EKG   Radiology No results found.  Procedures Procedures (including critical care time)  Medications Ordered in UC Medications  Tdap (BOOSTRIX) injection 0.5 mL (has no administration in time range)    Initial Impression / Assessment and Plan / UC Course  I have reviewed the triage vital signs and the nursing notes.  Pertinent labs & imaging results that were available during my care of the patient were reviewed by me and considered in my medical decision making (see chart for details).    Tdap administered as patient's last documented tetanus vaccine was 2013. Superficial lac covered with dermabond. Advised follow up with any signs of infection or any further concerns.   Final Clinical Impressions(s) / UC Diagnoses   Final diagnoses:  Laceration of right middle finger without foreign body without damage to nail, initial encounter   Discharge Instructions   None    ED Prescriptions   None    PDMP not reviewed this encounter.   Tomi Bamberger, PA-C 06/21/23 1549

## 2023-06-21 NOTE — ED Triage Notes (Signed)
"  I just got my finger slashed when working with blinds in my new apartment". "It is the end of the middle finger, right hand".

## 2023-07-07 ENCOUNTER — Ambulatory Visit (HOSPITAL_COMMUNITY): Payer: Medicare Other | Admitting: Student

## 2023-07-21 ENCOUNTER — Encounter: Payer: Medicare Other | Admitting: Obstetrics and Gynecology

## 2023-08-05 ENCOUNTER — Telehealth (HOSPITAL_COMMUNITY): Payer: Self-pay

## 2023-08-05 ENCOUNTER — Other Ambulatory Visit (HOSPITAL_COMMUNITY): Payer: Self-pay | Admitting: Student

## 2023-08-05 MED ORDER — TRAZODONE HCL 150 MG PO TABS: 150.0000 mg | ORAL_TABLET | Freq: Every evening | ORAL | 1 refills | Status: DC | PRN

## 2023-08-05 NOTE — Telephone Encounter (Signed)
 Done

## 2023-08-05 NOTE — Telephone Encounter (Signed)
 Patients pharmacy sent refill request forher Trazodone, patient has a follow up at the end of Jan. Please review and advise

## 2023-08-12 ENCOUNTER — Telehealth: Payer: Self-pay

## 2023-08-12 NOTE — Telephone Encounter (Signed)
 Copied from CRM [id number]. Topic: Referral - Request for Referral >> Aug 12, 2023  2:15 PM Tobias L wrote: Did the patient discuss referral with their provider in the last year? Yes, pt requesting new referral to new OBGYN office. Pt missed appt with previous referred office and now needing to go elsewhere   Type of order/referral and detailed reason for visit: OBGYN for pap smear   Preference of office, provider, location: [facility]  If referral order, have you been seen by this specialty before? No (If Yes, this issue or another issue? When? Where?  Can we respond through MyChart? Yes

## 2023-08-13 ENCOUNTER — Other Ambulatory Visit: Payer: Self-pay | Admitting: Family

## 2023-08-13 DIAGNOSIS — Z124 Encounter for screening for malignant neoplasm of cervix: Secondary | ICD-10-CM

## 2023-08-13 NOTE — Telephone Encounter (Signed)
 Complete

## 2023-08-25 ENCOUNTER — Ambulatory Visit (HOSPITAL_BASED_OUTPATIENT_CLINIC_OR_DEPARTMENT_OTHER): Payer: Medicare Other | Admitting: Student

## 2023-08-25 VITALS — BP 125/79 | HR 83 | Ht 64.5 in | Wt 309.4 lb

## 2023-08-25 DIAGNOSIS — F411 Generalized anxiety disorder: Secondary | ICD-10-CM | POA: Diagnosis not present

## 2023-08-25 DIAGNOSIS — F331 Major depressive disorder, recurrent, moderate: Secondary | ICD-10-CM

## 2023-08-25 DIAGNOSIS — Z8659 Personal history of other mental and behavioral disorders: Secondary | ICD-10-CM | POA: Diagnosis not present

## 2023-08-25 MED ORDER — HYDROXYZINE PAMOATE 50 MG PO CAPS: 50.0000 mg | ORAL_CAPSULE | Freq: Every evening | ORAL | 1 refills | Status: AC | PRN

## 2023-08-25 MED ORDER — OXCARBAZEPINE 600 MG PO TABS: 600.0000 mg | ORAL_TABLET | Freq: Two times a day (BID) | ORAL | 1 refills | Status: DC

## 2023-08-25 MED ORDER — ESCITALOPRAM OXALATE 10 MG PO TABS: 10.0000 mg | ORAL_TABLET | Freq: Every day | ORAL | 1 refills | Status: DC

## 2023-08-25 MED ORDER — TOPIRAMATE 100 MG PO TABS: 100.0000 mg | ORAL_TABLET | Freq: Every day | ORAL | 1 refills | Status: DC
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
## 2023-08-27 ENCOUNTER — Encounter (HOSPITAL_COMMUNITY): Payer: Self-pay | Admitting: Student

## 2023-08-27 DIAGNOSIS — Z8659 Personal history of other mental and behavioral disorders: Secondary | ICD-10-CM | POA: Insufficient documentation

## 2023-08-27 DIAGNOSIS — F331 Major depressive disorder, recurrent, moderate: Secondary | ICD-10-CM | POA: Insufficient documentation

## 2023-08-27 NOTE — Addendum Note (Signed)
Addended by: Everlena Cooper on: 08/27/2023 02:47 PM   Modules accepted: Level of Service

## 2023-09-10 ENCOUNTER — Other Ambulatory Visit: Payer: Self-pay

## 2023-09-10 ENCOUNTER — Encounter: Payer: Self-pay | Admitting: Internal Medicine

## 2023-09-10 ENCOUNTER — Ambulatory Visit (INDEPENDENT_AMBULATORY_CARE_PROVIDER_SITE_OTHER): Payer: Medicare Other | Admitting: Internal Medicine

## 2023-09-10 VITALS — BP 122/88 | HR 93 | Temp 97.8°F | Resp 18 | Ht 64.5 in | Wt 314.0 lb

## 2023-09-10 DIAGNOSIS — L918 Other hypertrophic disorders of the skin: Secondary | ICD-10-CM

## 2023-09-10 DIAGNOSIS — R229 Localized swelling, mass and lump, unspecified: Secondary | ICD-10-CM

## 2023-09-10 DIAGNOSIS — T7805XD Anaphylactic reaction due to tree nuts and seeds, subsequent encounter: Secondary | ICD-10-CM | POA: Diagnosis not present

## 2023-09-10 DIAGNOSIS — J3089 Other allergic rhinitis: Secondary | ICD-10-CM

## 2023-09-10 DIAGNOSIS — J302 Other seasonal allergic rhinitis: Secondary | ICD-10-CM

## 2023-09-10 DIAGNOSIS — L253 Unspecified contact dermatitis due to other chemical products: Secondary | ICD-10-CM

## 2023-09-10 MED ORDER — FLUTICASONE PROPIONATE 50 MCG/ACT NA SUSP: 2.0000 | Freq: Every day | NASAL | 5 refills | Status: DC

## 2023-09-10 MED ORDER — CETIRIZINE HCL 10 MG PO TABS: 10.0000 mg | ORAL_TABLET | Freq: Every day | ORAL | 5 refills | Status: DC

## 2023-09-10 MED ORDER — HYDROCORTISONE 2.5 % EX CREA: TOPICAL_CREAM | CUTANEOUS | 1 refills | Status: DC

## 2023-09-10 MED ORDER — AZELASTINE HCL 0.1 % NA SOLN: 1.0000 | Freq: Two times a day (BID) | NASAL | 5 refills | Status: DC | PRN

## 2023-09-10 NOTE — Progress Notes (Signed)
FOLLOW UP Date of Service/Encounter:  09/10/23   Subjective:  Misty Moses (DOB: 10-12-2000) is a 23 y.o. female [facility] for follow up for allergic rhinitis, food allergy.   History obtained from: chart review and patient.  Reports about 3 days ago, she sprayed a new deodorant spray on her chest that was from Secret whole body almond/honey and broke out in a itch bumpy rash on chest.  Nowhere else.  It is slowly getting better with Zyrtec but still itchy and raised.  She generally uses Dove deodorant and has never had issues with that one.   She also has some bumps/nodules in armpits and tags in groin area that she would like to see Dermatology for.    Avoiding treenuts, no accidental exposure. Allergies are doing fine.  Denies much trouble with congestion, drainage, runny nose.  Uses Flonase, Azelastine, Zyrtec PRN rather than daily since she has not had a lot of symptoms.  One episode of slight nosebleed, did not last long.   Past Medical History: Past Medical History:  Diagnosis Date   ADD (attention deficit disorder with hyperactivity)    Anxiety    Astigmatism    Back pain    Bipolar 1 disorder (HCC)    Borderline diabetes    TAKES METFORMIN AS PRECAUTION   Constipation    Depression    Excessive anger    EXPLOSIVE ANGER MANAGEMENT ISSUES   High blood pressure    Lactose intolerance     Objective:  BP 122/88 (BP Location: Right Arm, Patient Position: Sitting, Cuff Size: Large)   Pulse 93   Temp 97.8 F (36.6 C) (Temporal)   Resp 18   Ht 5' 4.5" (1.638 m)   Wt (!) 314 lb (142.4 kg)   SpO2 98%   BMI 53.07 kg/m  Body mass index is 53.07 kg/m. Physical Exam: GEN: alert, well developed HEENT: clear conjunctiva, nose with mild inferior turbinate hypertrophy, pink nasal mucosa, + clear rhinorrhea, no  cobblestoning HEART: regular rate and rhythm, no murmur LUNGS: clear to auscultation bilaterally, no coughing,  unlabored respiration SKIN: few papular raised lesions on chest  Assessment:   1. Seasonal and perennial allergic rhinitis   2. Anaphylaxis due to tree nut, subsequent encounter   3. Contact dermatitis due to chemicals   4. Skin nodule   5. Skin tag     Plan/Recommendations:  Possible Contact Dermatitis - Avoid the fragrance spray (Secret) that caused the rash.  Go back to the deodorant [facility]).  Ddx includes heat rash.  - Apply prescribed topical steroid (hydrocortisone 2.5%) to flared areas after the moisturizer has soaked into the skin. Taper off the topical steroids as the skin improves. Do not use topical steroid for more than 7-10 days at a time.  - Use Zyrtec 10mg  twice daily to help with itching.    Allergic Rhinitis: - Controlled  - Positive skin test 03/2023: trees, grasses, weeds, molds  - Avoidance measures discussed. - Use nasal saline rinses before nose sprays such as with Neilmed Sinus Rinse.  Use distilled water.   - Use Flonase 2 sprays each nostril daily. Aim upward and outward. - Use Azelastine 2 sprays each nostril twice daily as needed for runny nose, drainage, sneezing, congestion. Aim upward and outward. - Use Zyrtec 10 mg daily as needed for runny nose, sneezing, itchy watery eyes.  - Consider allergy shots as long term control of your symptoms by teaching  your immune system to be more tolerant of your allergy triggers  Food Allergy:  - please strictly avoid treenuts. - SPT 03/2023 positive to cashew, almond, hazelnut, pecan, pistachio. Negative to walnut and [place] nut.  - Initial rxn: sensation of throat itching, swelling, trouble talking  - for SKIN only reaction, okay to take Benadryl 25mg  capsules every 6 hours as needed - for SKIN + ANY additional symptoms, OR IF concern for LIFE THREATENING reaction = Epipen Autoinjector EpiPen 0.3 mg. - If using Epinephrine autoinjector, call 911 or go to the ER.   Skin Tags/Nodules - Will refer to dermatology for  further evaluation.      Return in about 6 months (around 03/09/2024).  Alesia Morin, MD [facility]

## 2023-09-10 NOTE — Patient Instructions (Addendum)
Possible Contact Dermatitis - Avoid the fragrance spray (Secret) that caused the rash.  Go back to the deodorant [facility]).   - Apply prescribed topical steroid (hydrocortisone 2.5%) to flared areas after the moisturizer has soaked into the skin. Taper off the topical steroids as the skin improves. Do not use topical steroid for more than 7-10 days at a time.  - Use Zyrtec 10mg  twice daily to help with itching.    Allergic Rhinitis: - Positive skin test 03/2023: trees, grasses, weeds, molds  - Avoidance measures discussed. - Use nasal saline rinses before nose sprays such as with Neilmed Sinus Rinse.  Use distilled water.   - Use Flonase 2 sprays each nostril daily. Aim upward and outward. - Use Azelastine 2 sprays each nostril twice daily as needed for runny nose, drainage, sneezing, congestion. Aim upward and outward. - Use Zyrtec 10 mg daily as needed for runny nose, sneezing, itchy watery eyes.  - Consider allergy shots as long term control of your symptoms by teaching your immune system to be more tolerant of your allergy triggers  Food Allergy:  - please strictly avoid treenuts. - SPT 03/2023 positive to cashew, almond, hazelnut, pecan, pistachio. Negative to walnut and [place] nut.  - Initial rxn: sensation of throat itching, swelling, trouble talking  - for SKIN only reaction, okay to take Benadryl 25mg  capsules every 6 hours as needed - for SKIN + ANY additional symptoms, OR IF concern for LIFE THREATENING reaction = Epipen Autoinjector EpiPen 0.3 mg. - If using Epinephrine autoinjector, call 911 or go to the ER.   Skin Tags/Nodules - Will refer to dermatology for further evaluation.

## 2023-09-26 ENCOUNTER — Other Ambulatory Visit (HOSPITAL_COMMUNITY): Payer: Self-pay | Admitting: Student

## 2023-09-26 ENCOUNTER — Telehealth (HOSPITAL_COMMUNITY): Payer: Self-pay

## 2023-09-26 MED ORDER — TRAZODONE HCL 150 MG PO TABS: 150.0000 mg | ORAL_TABLET | Freq: Every evening | ORAL | 1 refills | Status: DC | PRN

## 2023-09-26 NOTE — Telephone Encounter (Signed)
 Hello,   Pt/ Pharmacy is requesting for a refill of traZODone (DESYREL) 150 MG tablets to be sent to pharmacy.

## 2023-09-26 NOTE — Telephone Encounter (Signed)
 Done

## 2023-09-29 ENCOUNTER — Ambulatory Visit (HOSPITAL_COMMUNITY): Payer: Medicare Other | Admitting: Student

## 2023-09-29 ENCOUNTER — Encounter (HOSPITAL_COMMUNITY): Payer: Self-pay | Admitting: Student

## 2023-09-30 ENCOUNTER — Telehealth: Payer: Self-pay

## 2023-09-30 NOTE — Telephone Encounter (Signed)
-----   Message from Birder Robson sent at 09/10/2023 12:17 PM EST ----- Hello I would like for her to see Dermatology for skin tags/nodules. Thank you.

## 2023-10-01 ENCOUNTER — Ambulatory Visit: Payer: Medicare Other | Admitting: Internal Medicine

## 2023-10-03 NOTE — Telephone Encounter (Signed)
 I have attempted without success to contact this patient by phone to return their call and I left a message on answering machine.

## 2023-10-21 ENCOUNTER — Ambulatory Visit: Admitting: Family

## 2023-10-27 ENCOUNTER — Encounter: Payer: Medicare Other | Admitting: Obstetrics and Gynecology

## 2023-10-27 ENCOUNTER — Telehealth (HOSPITAL_BASED_OUTPATIENT_CLINIC_OR_DEPARTMENT_OTHER): Admitting: Student

## 2023-10-27 DIAGNOSIS — F411 Generalized anxiety disorder: Secondary | ICD-10-CM

## 2023-10-27 DIAGNOSIS — F39 Unspecified mood [affective] disorder: Secondary | ICD-10-CM

## 2023-10-27 DIAGNOSIS — F331 Major depressive disorder, recurrent, moderate: Secondary | ICD-10-CM

## 2023-10-27 DIAGNOSIS — Z8659 Personal history of other mental and behavioral disorders: Secondary | ICD-10-CM | POA: Insufficient documentation

## 2023-10-27 MED ORDER — OXCARBAZEPINE 600 MG PO TABS: 600.0000 mg | ORAL_TABLET | Freq: Two times a day (BID) | ORAL | 1 refills | Status: DC

## 2023-10-27 MED ORDER — TOPIRAMATE 50 MG PO TABS
50.0000 mg | ORAL_TABLET | Freq: Every day | ORAL | 1 refills | Status: DC
Start: 2023-10-27 — End: 2024-01-07

## 2023-10-27 MED ORDER — HYDROXYZINE HCL 25 MG PO TABS: 25.0000 mg | ORAL_TABLET | Freq: Three times a day (TID) | ORAL | 1 refills | Status: DC | PRN

## 2023-10-27 MED ORDER — ESCITALOPRAM OXALATE 10 MG PO TABS: 10.0000 mg | ORAL_TABLET | Freq: Every day | ORAL | 1 refills | Status: DC

## 2023-10-27 MED ORDER — TRAZODONE HCL 150 MG PO TABS
150.0000 mg | ORAL_TABLET | Freq: Every evening | ORAL | 1 refills | Status: DC | PRN
Start: 2023-10-27 — End: 2024-01-07
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
## 2023-10-29 ENCOUNTER — Encounter (HOSPITAL_COMMUNITY): Payer: Self-pay | Admitting: Student

## 2023-10-29 DIAGNOSIS — F39 Unspecified mood [affective] disorder: Secondary | ICD-10-CM | POA: Insufficient documentation

## 2023-11-06 ENCOUNTER — Ambulatory Visit: Admission: EM | Admit: 2023-11-06 | Discharge: 2023-11-06 | Disposition: A | Discharge status: Home / Self Care

## 2023-11-06 ENCOUNTER — Telehealth (HOSPITAL_COMMUNITY): Payer: Self-pay

## 2023-11-06 NOTE — ED Notes (Signed)
 Pt informed registration they decided not to stay due to the wait

## 2023-11-06 NOTE — Telephone Encounter (Signed)
 Patient is calling because she is becoming more depressed since she last saw you, she thinks it may be the change in medication. Patient has had to leave eraly form work twice this week. She is very tearful and would like a call back

## 2023-11-07 ENCOUNTER — Ambulatory Visit (INDEPENDENT_AMBULATORY_CARE_PROVIDER_SITE_OTHER): Admitting: Family

## 2023-11-07 ENCOUNTER — Encounter: Payer: Self-pay | Admitting: Family

## 2023-11-07 VITALS — BP 133/86 | HR 85 | Temp 98.9°F | Resp 18 | Ht 64.0 in | Wt 312.4 lb

## 2023-11-07 DIAGNOSIS — E669 Obesity, unspecified: Secondary | ICD-10-CM | POA: Diagnosis not present

## 2023-11-07 DIAGNOSIS — Z23 Encounter for immunization: Secondary | ICD-10-CM

## 2023-11-07 DIAGNOSIS — Z6841 Body Mass Index (BMI) 40.0 and over, adult: Secondary | ICD-10-CM

## 2023-11-07 DIAGNOSIS — Z7689 Persons encountering health services in other specified circumstances: Secondary | ICD-10-CM | POA: Diagnosis not present

## 2023-11-07 MED ORDER — PHENTERMINE HCL 15 MG PO CAPS: 15.0000 mg | ORAL_CAPSULE | ORAL | 0 refills | Status: DC

## 2023-11-07 NOTE — Progress Notes (Signed)
 No concerns.

## 2023-11-07 NOTE — Progress Notes (Signed)
 Patient ID: DICY SMIGEL, female    DOB: 10/19/2000  MRN: [id number]  CC: Weight Management  Subjective: Misty Moses is a 23 y.o. female who presents for weight management. She is accompanied by her mother.  Her concerns today include:  Reports goal weight 175 pounds. She is watching what she eats. She exercises.   Patient Active Problem List   Diagnosis Date Noted   Unspecified mood (affective) disorder (HCC) 10/29/2023   History of bipolar disorder 10/27/2023   MDD (major depressive disorder), recurrent episode, moderate (HCC) 08/27/2023   History of ADHD 08/27/2023   Anemia 05/20/2023   History of eustachian tube dysfunction 05/04/2020   Abnormal auditory perception of both ears 07/31/2018   Excessive cerumen in both ear canals 06/11/2018   Gait disturbance 10/14/2017   Weakness 10/14/2017   Nonintractable episodic headache 07/16/2017   New ACL tear, right, initial encounter 06/04/2017   Acute medial meniscus tear of right knee 06/04/2017   Cognitive developmental delay 05/19/2017   Callus of foot 03/15/2016   Gastroesophageal reflux disease without esophagitis 09/20/2015   Iron deficiency anemia secondary to inadequate dietary iron intake 09/20/2015   Morbid obesity (HCC) 08/25/2015   Generalized anxiety disorder 04/22/2013   Auditory processing disorder 04/22/2013   Severe episode of recurrent major depressive disorder, without psychotic features (HCC) 04/22/2013   Nexplanon insertion 03/16/2013   Contraception 03/16/2013     Current Outpatient Medications on File Prior to Visit  Medication Sig Dispense Refill   azelastine (ASTELIN) 0.1 % nasal spray Place 1 spray into both nostrils 2 (two) times daily as needed. Use in each nostril as directed 30 mL 5   cetirizine (ZYRTEC ALLERGY) 10 MG tablet Take 1 tablet (10 mg total) by mouth daily. 30 tablet 5   EPINEPHrine (EPIPEN 2-PAK) 0.3 mg/0.3 mL IJ SOAJ injection Inject 0.3 mg into the muscle as needed for  anaphylaxis. 2 each 1   escitalopram (LEXAPRO) 10 MG tablet Take 1 tablet (10 mg total) by mouth at bedtime. 30 tablet 1   fluticasone (FLONASE) 50 MCG/ACT nasal spray Place 2 sprays into both nostrils daily. 16 g 5   hydrocortisone 2.5 % cream Apply twice daily for flare, maximum 10 days. 30 g 1   hydrOXYzine (ATARAX) 25 MG tablet Take 1 tablet (25 mg total) by mouth 3 (three) times daily as needed for anxiety. 90 tablet 1   Iron, Ferrous Sulfate, 325 (65 Fe) MG TABS Take 325 mg by mouth daily. 90 tablet 0   oxcarbazepine (TRILEPTAL) 600 MG tablet Take 1 tablet (600 mg total) by mouth 2 (two) times daily. 60 tablet 1   Serdexmethylphen-Dexmethylphen (AZSTARYS) 39.2-7.8 MG CAPS Take 1 capsule by mouth daily.     topiramate (TOPAMAX) 50 MG tablet Take 1 tablet (50 mg total) by mouth at bedtime. 30 tablet 1   traZODone (DESYREL) 150 MG tablet Take 1 tablet (150 mg total) by mouth at bedtime as needed for sleep. 30 tablet 1   No current facility-administered medications on file prior to visit.    Allergies  Allergen Reactions   Hydrocodone Other (See Comments)    Dizziness   Pollen Extract Cough    Social History   Socioeconomic History   Marital status: Single    Spouse name: Not on file   Number of children: Not on file   Years of education: Not on file   Highest education level: Not on file  Occupational History   Occupation: Oncologist  Tobacco Use   Smoking status: Never    Passive exposure: Never   Smokeless tobacco: Never  Vaping Use   Vaping status: Never Used  Substance and Sexual Activity   Alcohol use: No   Drug use: No   Sexual activity: Not Currently    Birth control/protection: Implant, Condom    Comment: pt. reports she is sexually active with boyfriend and he uses condoms  Other Topics Concern   Not on file  Social History Narrative   LIVES WITH MOTHER AND BROTHER   IN 10TH GRADE at [employer]    NORMAL BIRTH HISTORY   She enjoys listening  to music, talking, and watching videos   Social Drivers of Health   Financial Resource Strain: Low Risk  (05/19/2023)   Overall Financial Resource Strain (CARDIA)    Difficulty of Paying Living Expenses: Not hard at all  Food Insecurity: No Food Insecurity (05/19/2023)   Hunger Vital Sign    Worried About Running Out of Food in the Last Year: Never true    Ran Out of Food in the Last Year: Never true  Transportation Needs: No Transportation Needs (05/19/2023)   PRAPARE - Administrator, Civil Service (Medical): No    Lack of Transportation (Non-Medical): No  Physical Activity: Insufficiently Active (05/19/2023)   Exercise Vital Sign    Days of Exercise per Week: 2 days    Minutes of Exercise per Session: 20 min  Stress: No Stress Concern Present (05/19/2023)   [employer] of Occupational Health - Occupational Stress Questionnaire    Feeling of Stress : Not at all  Social Connections: Socially Isolated (05/19/2023)   Social Connection and Isolation Panel [NHANES]    Frequency of Communication with Friends and Family: More than three times a week    Frequency of Social Gatherings with Friends and Family: More than three times a week    Attends Religious Services: Never    Database administrator or Organizations: No    Attends Banker Meetings: Never    Marital Status: Never married  Intimate Partner Violence: Not At Risk (05/19/2023)   Humiliation, Afraid, Rape, and Kick questionnaire    Fear of Current or Ex-Partner: No    Emotionally Abused: No    Physically Abused: No    Sexually Abused: No    Family History  Problem Relation Age of Onset   Hypertension Mother    Migraines Mother    Anxiety disorder Mother    ADD / ADHD Mother    Depression Mother    Bipolar disorder Mother    Seizures Neg Hx    Autism Neg Hx    Schizophrenia Neg Hx     Past Surgical History:  Procedure Laterality Date   ANTERIOR CRUCIATE LIGAMENT REPAIR Right  06/04/2017   Procedure: Right knee arthroscopic hamsting anterior cruciate ligament reconstruction with partial  medial menisectomy;  Surgeon: Yolonda Kida, MD;  Location: [facility];  Service: Orthopedics;  Laterality: Right;   TONSILLECTOMY      ROS: Review of Systems Negative except as stated above  PHYSICAL EXAM: BP 133/86   Pulse 85   Temp 98.9 F (37.2 C) (Oral)   Resp 18   Ht 5\' 4"  (1.626 m)   Wt (!) 312 lb 6.4 oz (141.7 kg)   SpO2 96%   BMI 53.62 kg/m   Physical Exam HENT:     Head: Normocephalic and atraumatic.     Nose: Nose  normal.     Mouth/Throat:     Mouth: Mucous membranes are moist.     Pharynx: Oropharynx is clear.  Eyes:     Extraocular Movements: Extraocular movements intact.     Conjunctiva/sclera: Conjunctivae normal.     Pupils: Pupils are equal, round, and reactive to light.  Cardiovascular:     Rate and Rhythm: Normal rate and regular rhythm.     Pulses: Normal pulses.     Heart sounds: Normal heart sounds.  Pulmonary:     Effort: Pulmonary effort is normal.     Breath sounds: Normal breath sounds.  Musculoskeletal:        General: Normal range of motion.     Cervical back: Normal range of motion and neck supple.  Neurological:     General: No focal deficit present.     Mental Status: She is alert and oriented to person, place, and time.  Psychiatric:        Mood and Affect: Mood normal.        Behavior: Behavior normal.    ASSESSMENT AND PLAN: 1. Encounter for weight management (Primary) 2. BMI 50.0-59.9, adult (HCC) - Phentermine as prescribed. Counseled on medication adherence and adverse effects.  - I did check the [facility] prescription drug database. - Counseled on low-sodium DASH diet and 150 minutes of moderate intensity exercise per week as tolerated to assist with weight management.  - Patient declined referral to Medical Weight Management. - Follow-up with primary provider in 4 weeks or sooner if  needed. - phentermine 15 MG capsule; Take 1 capsule (15 mg total) by mouth every morning.  Dispense: 30 capsule; Refill: 0  3. Need for HPV vaccine - Administered. - HPV 9-valent vaccine,Recombinat   Patient was given the opportunity to ask questions.  Patient verbalized understanding of the plan and was able to repeat key elements of the plan. Patient was given clear instructions to go to Emergency Department or return to medical center if symptoms don't improve, worsen, or new problems develop.The patient verbalized understanding.   Orders Placed This Encounter  Procedures   HPV 9-valent vaccine,Recombinat     Requested Prescriptions   Signed Prescriptions Disp Refills   phentermine 15 MG capsule 30 capsule 0    Sig: Take 1 capsule (15 mg total) by mouth every morning.    Return in about 4 weeks (around 12/05/2023) for Follow-Up or next available weight check.  Rema Fendt, NP

## 2023-11-10 ENCOUNTER — Encounter (HOSPITAL_COMMUNITY): Payer: Self-pay | Admitting: Student

## 2023-11-14 ENCOUNTER — Ambulatory Visit

## 2023-11-14 ENCOUNTER — Ambulatory Visit
Admission: EM | Admit: 2023-11-14 | Discharge: 2023-11-14 | Disposition: A | Discharge status: Home / Self Care | Attending: Internal Medicine | Admitting: Internal Medicine

## 2023-11-14 ENCOUNTER — Encounter: Payer: Self-pay | Admitting: Emergency Medicine

## 2023-11-14 DIAGNOSIS — Z202 Contact with and (suspected) exposure to infections with a predominantly sexual mode of transmission: Secondary | ICD-10-CM | POA: Insufficient documentation

## 2023-11-14 DIAGNOSIS — Z113 Encounter for screening for infections with a predominantly sexual mode of transmission: Secondary | ICD-10-CM

## 2023-11-14 NOTE — ED Provider Notes (Signed)
 [facility]    CSN: [id number] Arrival date & time: 11/14/23  0800      History   Chief Complaint Chief Complaint  Patient presents with   Exposure to STD    HPI Misty Moses is a 24 y.o. female.   Patient presents today for STD testing.  Denies any associated symptoms.  Reports that she was told that she may have been exposed to genital herpes.  Although, patient denies any new vaginal lesions. Last menstrual cycle was 11/05/23.   Exposure to STD    Past Medical History:  Diagnosis Date   ADD (attention deficit disorder with hyperactivity)    Anxiety    Astigmatism    Back pain    Bipolar 1 disorder (HCC)    Borderline diabetes    TAKES METFORMIN  AS PRECAUTION   Constipation    Depression    Excessive anger    EXPLOSIVE ANGER MANAGEMENT ISSUES   High blood pressure    Lactose intolerance     Patient Active Problem List   Diagnosis Date Noted   Unspecified mood (affective) disorder (HCC) 10/29/2023   History of bipolar disorder 10/27/2023   MDD (major depressive disorder), recurrent episode, moderate (HCC) 08/27/2023   History of ADHD 08/27/2023   Anemia 05/20/2023   Patellofemoral disorders, right knee 03/08/2021   Body mass index (BMI) 50.0-59.9, adult (HCC) 03/08/2021   Obesity 03/08/2021   Pain in right knee 03/06/2021   History of eustachian tube dysfunction 05/04/2020   Abnormal auditory perception of both ears 07/31/2018   Excessive cerumen in both ear canals 06/11/2018   Gait disturbance 10/14/2017   Weakness 10/14/2017   Nonintractable episodic headache 07/16/2017   New ACL tear, right, initial encounter 06/04/2017   Acute medial meniscus tear of right knee 06/04/2017   Cognitive developmental delay 05/19/2017   Callus of foot 03/15/2016   Gastroesophageal reflux disease without esophagitis 09/20/2015   Iron  deficiency anemia secondary to inadequate dietary iron  intake 09/20/2015   Morbid obesity (HCC) 08/25/2015    Generalized anxiety disorder 04/22/2013   Auditory processing disorder 04/22/2013   Severe episode of recurrent major depressive disorder, without psychotic features (HCC) 04/22/2013   Nexplanon insertion 03/16/2013   Contraception 03/16/2013    Past Surgical History:  Procedure Laterality Date   ANTERIOR CRUCIATE LIGAMENT REPAIR Right 06/04/2017   Procedure: Right knee arthroscopic hamsting anterior cruciate ligament reconstruction with partial  medial menisectomy;  Surgeon: Janeth Medicus, MD;  Location: [facility];  Service: Orthopedics;  Laterality: Right;   TONSILLECTOMY      OB History     Gravida  0   Para  0   Term  0   Preterm  0   AB  0   Living  0      SAB  0   IAB  0   Ectopic  0   Multiple  0   Live Births               Home Medications    Prior to Admission medications   Medication Sig Start Date End Date Taking? Authorizing Provider  cetirizine  (ZYRTEC  ALLERGY ) 10 MG tablet Take 1 tablet (10 mg total) by mouth daily. 09/10/23  Yes Kandice Orleans, MD  escitalopram  (LEXAPRO ) 10 MG tablet Take 1 tablet (10 mg total) by mouth at bedtime. 10/27/23 12/26/23 Yes Shery Done, MD  hydrOXYzine  (ATARAX ) 25 MG tablet Take 1 tablet (25 mg total) by mouth 3 (three) times  daily as needed for anxiety. 10/27/23  Yes Shery Done, MD  oxcarbazepine  (TRILEPTAL ) 600 MG tablet Take 1 tablet (600 mg total) by mouth 2 (two) times daily. 10/27/23 12/26/23 Yes Shery Done, MD  phentermine  15 MG capsule Take 1 capsule (15 mg total) by mouth every morning. 11/07/23  Yes Rogerio Clay, Amy J, NP  topiramate  (TOPAMAX ) 50 MG tablet Take 1 tablet (50 mg total) by mouth at bedtime. 10/27/23 12/26/23 Yes Shery Done, MD  traZODone  (DESYREL ) 150 MG tablet Take 1 tablet (150 mg total) by mouth at bedtime as needed for sleep. 10/27/23 12/26/23 Yes Cosby, Jearlean Mince, MD  azelastine  (ASTELIN ) 0.1 % nasal spray Place 1 spray into both nostrils 2 (two) times daily  as needed. Use in each nostril as directed Patient not taking: Reported on 11/14/2023 09/10/23   Kandice Orleans, MD  EPINEPHrine  (EPIPEN  2-PAK) 0.3 mg/0.3 mL IJ SOAJ injection Inject 0.3 mg into the muscle as needed for anaphylaxis. 06/03/23   Kandice Orleans, MD  fluticasone  (FLONASE ) 50 MCG/ACT nasal spray Place 2 sprays into both nostrils daily. Patient not taking: Reported on 11/14/2023 09/10/23   Kandice Orleans, MD  hydrocortisone  2.5 % cream Apply twice daily for flare, maximum 10 days. Patient not taking: Reported on 11/14/2023 09/10/23   Kandice Orleans, MD  Iron , Ferrous Sulfate , 325 (65 Fe) MG TABS Take 325 mg by mouth daily. Patient not taking: Reported on 11/14/2023 05/20/23   Senaida Dama, NP  Serdexmethylphen-Dexmethylphen (AZSTARYS) 39.2-7.8 MG CAPS Take 1 capsule by mouth daily. Patient not taking: Reported on 11/14/2023    [provider]    Family History Family History  Problem Relation Age of Onset   Hypertension Mother    Migraines Mother    Anxiety disorder Mother    ADD / ADHD Mother    Depression Mother    Bipolar disorder Mother    Seizures Neg Hx    Autism Neg Hx    Schizophrenia Neg Hx     Social History Social History   Tobacco Use   Smoking status: Never    Passive exposure: Never   Smokeless tobacco: Never  Vaping Use   Vaping status: Never Used  Substance Use Topics   Alcohol use: No   Drug use: No     Allergies   Hydrocodone, Levonorgestrel-ethinyl estrad, and Pollen extract   Review of Systems Review of Systems Per HPI  Physical Exam Triage Vital Signs ED Triage Vitals [11/14/23 0824]  Encounter Vitals Group     BP (!) 151/89     Systolic BP Percentile      Diastolic BP Percentile      Pulse Rate 93     Resp 16     Temp 98.2 F (36.8 C)     Temp Source Oral     SpO2 99 %     Weight      Height      Head Circumference      Peak Flow      Pain Score 0     Pain Loc      Pain Education      Exclude from Growth Chart     No data found.  Updated Vital Signs BP (!) 151/89 (BP Location: Left Arm)   Pulse 93   Temp 98.2 F (36.8 C) (Oral)   Resp 16   LMP 11/05/2023 (Exact Date)   SpO2 99%   Visual Acuity Right Eye Distance:   Left Eye Distance:  Bilateral Distance:    Right Eye Near:   Left Eye Near:    Bilateral Near:     Physical Exam Constitutional:      General: She is not in acute distress.    Appearance: Normal appearance. She is not toxic-appearing or diaphoretic.  HENT:     Head: Normocephalic and atraumatic.  Eyes:     Extraocular Movements: Extraocular movements intact.     Conjunctiva/sclera: Conjunctivae normal.  Pulmonary:     Effort: Pulmonary effort is normal.  Genitourinary:    Comments: Deferred with shared decision making. Self swab performed.  Neurological:     General: No focal deficit present.     Mental Status: She is alert and oriented to person, place, and time. Mental status is at baseline.  Psychiatric:        Mood and Affect: Mood normal.        Behavior: Behavior normal.        Thought Content: Thought content normal.        Judgment: Judgment normal.      UC Treatments / Results  Labs (all labs ordered are listed, but only abnormal results are displayed) Labs Reviewed  HIV ANTIBODY (ROUTINE TESTING W REFLEX)  RPR  CERVICOVAGINAL ANCILLARY ONLY    EKG   Radiology No results found.  Procedures Procedures (including critical care time)  Medications Ordered in UC Medications - No data to display  Initial Impression / Assessment and Plan / UC Course  I have reviewed the triage vital signs and the nursing notes.  Pertinent labs & imaging results that were available during my care of the patient were reviewed by me and considered in my medical decision making (see chart for details).     cervicovaginal swab, HIV, RPR test pending.  Patient reports possible exposure to genital herpes but is denying any active lesions.  Discussed with  patient I am only able to test for genital herpes at urgent care if there is any active vaginal lesions. Patient denies vaginal lesions. Patient voiced understanding of this.  Advised patient to follow-up with PCP or urgent care if any lesions occur.  Also advised PCP follow-up for any further testing.  Patient verbalized understanding and was agreeable with plan. Final Clinical Impressions(s) / UC Diagnoses   Final diagnoses:  Screening examination for venereal disease     Discharge Instructions      STD testing is pending.  We will call if it is abnormal.  Refrain from sexual activity until test results and treatment are complete.    ED Prescriptions   None    PDMP not reviewed this encounter.   Dodson Freestone, [place] 11/14/23 [phone]

## 2023-11-14 NOTE — Discharge Instructions (Signed)
 STD testing is pending.  We will call if it is abnormal.  Refrain from sexual activity until test results and treatment are complete.

## 2023-11-14 NOTE — ED Triage Notes (Signed)
 Pt here for STD testing - swab and blood work. Asymptomatic but notes possible exposure.

## 2023-11-15 LAB — RPR: RPR Ser Ql: NONREACTIVE

## 2023-11-15 LAB — HIV ANTIBODY (ROUTINE TESTING W REFLEX): HIV Screen 4th Generation wRfx: NONREACTIVE

## 2023-11-17 NOTE — Telephone Encounter (Signed)
 Yes, I had a conversation with her through her MyChart, which is documented on the chart. Thanks!

## 2023-11-17 NOTE — Telephone Encounter (Signed)
 Just following up on this message. I can not close it until I know that it has been completed. Please review and advise, thank you

## 2023-11-19 ENCOUNTER — Telehealth (HOSPITAL_COMMUNITY): Payer: Self-pay

## 2023-11-19 LAB — CERVICOVAGINAL ANCILLARY ONLY
Bacterial Vaginitis (gardnerella): POSITIVE — AB
Candida Glabrata: NEGATIVE
Candida Vaginitis: NEGATIVE
Chlamydia: POSITIVE — AB
Comment: NEGATIVE
Comment: NEGATIVE
Comment: NEGATIVE
Comment: NEGATIVE
Comment: NEGATIVE
Comment: NORMAL
Neisseria Gonorrhea: NEGATIVE
Trichomonas: NEGATIVE

## 2023-11-19 MED ORDER — DOXYCYCLINE HYCLATE 100 MG PO TABS
100.0000 mg | ORAL_TABLET | Freq: Two times a day (BID) | ORAL | 0 refills | Status: AC
Start: 2023-11-19 — End: 2023-11-26

## 2023-11-19 NOTE — Telephone Encounter (Signed)
 Per protocol, pt requires tx with Doxycycline.  Attempted to reach patient x1. LVM.  Rx sent to pharmacy on file.

## 2023-11-21 ENCOUNTER — Telehealth (HOSPITAL_COMMUNITY): Payer: Self-pay

## 2023-11-21 MED ORDER — ONDANSETRON 4 MG PO TBDP: 4.0000 mg | ORAL_TABLET | Freq: Three times a day (TID) | ORAL | 0 refills | Status: DC | PRN

## 2023-11-21 MED ORDER — DOXYCYCLINE HYCLATE 100 MG PO TABS: 100.0000 mg | ORAL_TABLET | Freq: Two times a day (BID) | ORAL | 0 refills | Status: AC

## 2023-11-21 NOTE — Telephone Encounter (Signed)
 Pt called reporting she has been vomiting after taking Doxycycline .  States she has kept all but two tablets down. Reports she took two tablets together on an empty stomach when she first picked up the medication, and vomiting those up. Advised pt to only take one pill at a time.  Can we send anti-emetic for pt while she is finishing treatment? And does she need to replace the first two doses?  Thank you

## 2023-11-21 NOTE — Telephone Encounter (Signed)
 Agree with plan to have patient take 1 tablet twice daily as doxycycline  was prescribed.  I have provided prescription for Zofran  to alleviate patient's symptoms of nausea.  Recommend she take 1 tablet 30 minutes prior to each dose of Zofran .  Have also sent prescription for doxycycline  100 mg twice daily x 2 doses to replace the 2 tablets that she vomited up.

## 2023-11-23 ENCOUNTER — Other Ambulatory Visit: Payer: Self-pay

## 2023-11-23 ENCOUNTER — Ambulatory Visit (HOSPITAL_COMMUNITY)
Admission: EM | Admit: 2023-11-23 | Discharge: 2023-11-23 | Disposition: A | Discharge status: Home / Self Care | Source: Ambulatory Visit | Attending: Emergency Medicine | Admitting: Emergency Medicine

## 2023-11-23 ENCOUNTER — Emergency Department (HOSPITAL_COMMUNITY)
Admission: EM | Admit: 2023-11-23 | Discharge: 2023-11-23 | Disposition: A | Discharge status: Home / Self Care | Attending: Emergency Medicine | Admitting: Emergency Medicine

## 2023-11-23 DIAGNOSIS — I10 Essential (primary) hypertension: Secondary | ICD-10-CM | POA: Insufficient documentation

## 2023-11-23 DIAGNOSIS — T7421XA Adult sexual abuse, confirmed, initial encounter: Secondary | ICD-10-CM | POA: Diagnosis present

## 2023-11-23 DIAGNOSIS — Z0441 Encounter for examination and observation following alleged adult rape: Secondary | ICD-10-CM | POA: Insufficient documentation

## 2023-11-23 LAB — COMPREHENSIVE METABOLIC PANEL WITH GFR
ALT: 19 U/L (ref 0–44)
AST: 25 U/L (ref 15–41)
Albumin: 3.7 g/dL (ref 3.5–5.0)
Alkaline Phosphatase: 64 U/L (ref 38–126)
Anion gap: 10 (ref 5–15)
BUN: 7 mg/dL (ref 6–20)
CO2: 20 mmol/L — ABNORMAL LOW (ref 22–32)
Calcium: 8.7 mg/dL — ABNORMAL LOW (ref 8.9–10.3)
Chloride: 105 mmol/L (ref 98–111)
Creatinine, Ser: 0.93 mg/dL (ref 0.44–1.00)
GFR, Estimated: >60 mL/min (ref >60)
Glucose, Bld: 85 mg/dL (ref 70–99)
Potassium: 3.3 mmol/L — ABNORMAL LOW (ref 3.5–5.1)
Sodium: 135 mmol/L (ref 135–145)
Total Bilirubin: 0.5 mg/dL (ref 0.0–1.2)
Total Protein: 7.5 g/dL (ref 6.5–8.1)

## 2023-11-23 LAB — CBC WITH DIFFERENTIAL/PLATELET
Abs Immature Granulocytes: 0.04 10*3/uL (ref 0.00–0.07)
Basophils Absolute: 0.1 10*3/uL (ref 0.0–0.1)
Basophils Relative: 1 %
Eosinophils Absolute: 0.1 10*3/uL (ref 0.0–0.5)
Eosinophils Relative: 1 %
HCT: 33.9 % — ABNORMAL LOW (ref 36.0–46.0)
Hemoglobin: 9.8 g/dL — ABNORMAL LOW (ref 12.0–15.0)
Immature Granulocytes: 0 %
Lymphocytes Relative: 20 %
Lymphs Abs: 2.2 10*3/uL (ref 0.7–4.0)
MCH: 20.9 pg — ABNORMAL LOW (ref 26.0–34.0)
MCHC: 28.9 g/dL — ABNORMAL LOW (ref 30.0–36.0)
MCV: 72.1 fL — ABNORMAL LOW (ref 80.0–100.0)
Monocytes Absolute: 0.6 10*3/uL (ref 0.1–1.0)
Monocytes Relative: 5 %
Neutro Abs: 7.9 10*3/uL — ABNORMAL HIGH (ref 1.7–7.7)
Neutrophils Relative %: 73 %
Platelets: 419 10*3/uL — ABNORMAL HIGH (ref 150–400)
RBC: 4.7 MIL/uL (ref 3.87–5.11)
RDW: 16.1 % — ABNORMAL HIGH (ref 11.5–15.5)
WBC: 11 10*3/uL — ABNORMAL HIGH (ref 4.0–10.5)
nRBC: 0 % (ref 0.0–0.2)

## 2023-11-23 LAB — URINALYSIS, ROUTINE W REFLEX MICROSCOPIC
Bacteria, UA: NONE SEEN
Bilirubin Urine: NEGATIVE
Glucose, UA: NEGATIVE mg/dL
Hgb urine dipstick: NEGATIVE
Ketones, ur: 5 mg/dL — AB
Leukocytes,Ua: NEGATIVE
Nitrite: NEGATIVE
Protein, ur: 30 mg/dL — AB
Specific Gravity, Urine: 1.023 (ref 1.005–1.030)
pH: 6 (ref 5.0–8.0)

## 2023-11-23 LAB — RAPID HIV SCREEN (HIV 1/2 AB+AG)
HIV 1/2 Antibodies: NONREACTIVE
HIV-1 P24 Antigen - HIV24: NONREACTIVE

## 2023-11-23 LAB — PREGNANCY, URINE: Preg Test, Ur: NEGATIVE

## 2023-11-23 LAB — HEPATITIS C ANTIBODY: HCV Ab: NONREACTIVE

## 2023-11-23 LAB — HEPATITIS B SURFACE ANTIGEN: Hepatitis B Surface Ag: NONREACTIVE

## 2023-11-23 MED ORDER — BICTEGRAVIR-EMTRICITAB-TENOFOV 50-200-25 MG PO TABS
1.0000 | ORAL_TABLET | Freq: Every day | ORAL | 0 refills | Status: DC
  Filled 2023-11-23: qty 30, 30d supply, fill #0

## 2023-11-23 MED ORDER — ULIPRISTAL ACETATE 30 MG PO TABS
30.0000 mg | ORAL_TABLET | Freq: Once | ORAL | Status: AC
  Administered 2023-11-23: 30 mg via ORAL

## 2023-11-23 MED ORDER — IBUPROFEN 400 MG PO TABS
600.0000 mg | ORAL_TABLET | Freq: Once | ORAL | Status: AC
  Administered 2023-11-23: 600 mg via ORAL
  Filled 2023-11-23: qty 1

## 2023-11-23 MED ORDER — LIDOCAINE HCL (PF) 1 % IJ SOLN
1.0000 mL | Freq: Once | INTRAMUSCULAR | Status: AC
  Administered 2023-11-23: 1 mL

## 2023-11-23 MED ORDER — PROMETHAZINE HCL 25 MG PO TABS
25.0000 mg | ORAL_TABLET | Freq: Four times a day (QID) | ORAL | Status: DC | PRN
  Administered 2023-11-23: 25 mg via ORAL

## 2023-11-23 MED ORDER — METRONIDAZOLE 500 MG PO TABS
2000.0000 mg | ORAL_TABLET | Freq: Once | ORAL | Status: AC
  Administered 2023-11-23: 2000 mg via ORAL

## 2023-11-23 MED ORDER — CEFTRIAXONE SODIUM 500 MG IJ SOLR
500.0000 mg | Freq: Once | INTRAMUSCULAR | Status: AC
  Administered 2023-11-23: 500 mg via INTRAMUSCULAR

## 2023-11-23 MED ORDER — BICTEGRAVIR-EMTRICITAB-TENOFOV 50-200-25 MG PO PREPACK
1.0000 | ORAL_TABLET | Freq: Every day | ORAL | Status: DC
  Administered 2023-11-23: 1 via ORAL
  Filled 2023-11-23: qty 1
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
## 2023-11-23 NOTE — ED Notes (Signed)
SANE RN bedside
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
## 2023-11-23 NOTE — Consult Note (Signed)
 POCT pregnancy test negative
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
## 2023-11-23 NOTE — Discharge Instructions (Addendum)
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 this medication. Get rid of any unused medication after the expiration date. To get rid of medications that are no longer needed or have expired: Take the medication to a medication take-back program. Check with your pharmacy or law enforcement to find a location. If you cannot return the medication, ask your pharmacist or care  team how to get rid of this medication safely.  NOTE: This sheet is a summary. It may not cover all possible information. If you have questions about this medicine, talk to your doctor, pharmacist, or health care provider.  2024 Elsevier/Gold Standard (2021-09-24 00:00:00) Promethazine  Tablets  What is this medication? PROMETHAZINE  (proe METH a zeen) prevents and treats the symptoms of an allergic reaction. It works by blocking histamine, a substance released by the body during an allergic reaction. It may also help you relax, go to sleep, and relieve nausea, vomiting, or pain before  or after procedures. It can also prevent and treat motion sickness. It works by helping your nervous system calm down by blocking substances in the body that may cause nausea and vomiting. It belongs to a group of medications called antihistamines. This medicine may be used for other purposes; ask your health care provider or pharmacist if you have questions. COMMON BRAND NAME(S): Phenergan   What should I tell my care team before I take this medication? They need to know if you have any of these conditions: Blockage in your bowels Diabetes Glaucoma Have trouble controlling your muscles Heart disease Liver disease Low blood cell levels (white cells, red cells, and platelets) Lung or breathing disease, such as asthma Parkinson disease Prostate disease Seizures Stomach or intestine problems Trouble passing urine An unusual or allergic reaction to promethazine , sulfites, other medications, foods, dyes, or preservatives Pregnant or trying to get pregnant Breastfeeding  How should I use this medication? Take this medication by mouth with a glass of water. Follow the directions on the prescription label. Take your doses at regular intervals. Do not take your medication more often than directed. Talk to your care team about the use of this medication in children. Special care may be needed. This medication should not be given to infants and children younger than 55 years old. Overdosage: If you think you have taken too much of this medicine contact a poison control center or emergency room at once. NOTE: This medicine is only for you. Do not share this medicine with others.  What if I miss a dose? If you miss a dose, take it as soon as you can. If it is almost time for your next dose, take only that dose. Do not take double or extra doses.  What may interact with this medication? Alcohol Antihistamines for allergy , cough, and cold Atropine Certain medications for anxiety or sleep Certain  medications for bladder problems, such as oxybutynin or tolterodine Certain medications for depression, such as amitriptyline, fluoxetine, sertraline Certain medications for Parkinson disease, such as benztropine or trihexyphenidyl Certain medications for seizures, such as phenobarbital, primidone, phenytoin Certain medications for stomach problems, such as dicyclomine, hyoscyamine Certain medications for travel sickness, such as scopolamine  Epinephrine  General anesthetics, such as halothane, isoflurane, methoxyflurane, propofol  Ipratropium MAOIs, such as Marplan, Nardil, and Parnate Medications for blood pressure Medications that relax muscles for surgery Metoclopramide  Opioids This list may not describe all possible interactions. Give your health care provider a list of all the medicines, herbs, non-prescription drugs, or dietary supplements you use. Also tell them if you smoke, drink alcohol, or use illegal drugs. Some items may interact with your medicine.  What should I watch for while using this medication? Visit your care team for regular checks on your progress. Tell your care team if your symptoms do not start to get better or if they get worse. This medication may affect your coordination, reaction time, or judgment. Do not  drive or operate machinery until you know how this medication affects you. Sit up or stand slowly to reduce the risk of dizzy or fainting spells. Drinking alcohol with this medication can increase the risk of these side effects. Your mouth may get dry. Chewing sugarless gum or sucking hard candy and drinking plenty of water may help. Contact your care team if the problem does not go away or is severe. This medication may cause dry eyes and blurred vision. If you wear contact lenses, you may feel some discomfort. Lubricating eye drops may help. See your care team if the problem does not go away or is severe. This medication can make you more sensitive to the sun.  Keep out of the sun. If you cannot avoid being in the sun, wear protective clothing and sunscreen. Do not use sun lamps, tanning beds, or tanning booths. This medication may increase blood sugar. The risk may be higher in patients who already have diabetes. Ask your care team what you can do to lower your risk of diabetes while taking this medication.  What side effects may I notice from receiving this medication? Side effects that you should report to your care team as soon as possible: Allergic reactions--skin rash, itching, hives, swelling of the face, lips, tongue, or throat CNS depression--slow or shallow breathing, shortness of breath, feeling faint, dizziness, confusion, trouble staying awake High fever stiff muscles, increased sweating, fast or irregular heartbeat, and confusion, which may be signs of neuroleptic malignant syndrome Infection--fever, chills, cough, or sore throat Liver injury--right upper belly pain, loss of appetite, nausea, light-colored stool, dark yellow or brown urine, yellowing skin or eyes, unusual weakness or fatigue Seizures Sudden eye pain or change in vision such as blurry vision, seeing halos around lights, vision loss Trouble passing urine Uncontrolled and repetitive body movements, muscle stiffness or spasms, tremors or shaking, loss of balance or coordination, restlessness, shuffling walk, which may be signs of extrapyramidal symptoms (EPS) Side effects that usually do not require medical attention (report to your care team if they continue or are bothersome): Confusion Constipation Dizziness Drowsiness Dry mouth Sensitivity to light Vivid dreams or nightmares  This list may not describe all possible side effects. Call your doctor for medical advice about side effects. You may report side effects to FDA at 1-800-FDA-1088.  Where should I keep my medication? Keep out of the reach of children. Store at room temperature, between 20 and 25 degrees C (68  and 77 degrees F). Protect from light. Throw away any unused medication after the expiration date.  NOTE: This sheet is a summary. It may not cover all possible information. If you have questions about this medicine, talk to your doctor, pharmacist, or health care provider.  2024 Elsevier/Gold Standard (2022-01-26 00:00:00)
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
## 2023-11-24 ENCOUNTER — Other Ambulatory Visit (HOSPITAL_COMMUNITY): Payer: Self-pay

## 2023-11-24 ENCOUNTER — Other Ambulatory Visit: Payer: Self-pay

## 2023-11-24 LAB — RPR: RPR Ser Ql: NONREACTIVE

## 2023-11-25 ENCOUNTER — Other Ambulatory Visit (HOSPITAL_COMMUNITY): Payer: Self-pay

## 2023-11-28 ENCOUNTER — Ambulatory Visit (HOSPITAL_COMMUNITY): Admitting: Licensed Clinical Social Worker

## 2023-11-28 DIAGNOSIS — F411 Generalized anxiety disorder: Secondary | ICD-10-CM

## 2023-11-28 DIAGNOSIS — F331 Major depressive disorder, recurrent, moderate: Secondary | ICD-10-CM

## 2023-11-28 DIAGNOSIS — Z8659 Personal history of other mental and behavioral disorders: Secondary | ICD-10-CM
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
## 2023-12-05 ENCOUNTER — Encounter: Payer: Self-pay | Admitting: Family

## 2023-12-05 ENCOUNTER — Ambulatory Visit (INDEPENDENT_AMBULATORY_CARE_PROVIDER_SITE_OTHER): Admitting: Family

## 2023-12-05 VITALS — BP 130/85 | HR 90 | Temp 98.4°F | Resp 18 | Ht 64.5 in | Wt 304.8 lb

## 2023-12-05 DIAGNOSIS — Z6841 Body Mass Index (BMI) 40.0 and over, adult: Secondary | ICD-10-CM | POA: Diagnosis not present

## 2023-12-05 DIAGNOSIS — Z7689 Persons encountering health services in other specified circumstances: Secondary | ICD-10-CM

## 2023-12-05 MED ORDER — PHENTERMINE HCL 30 MG PO CAPS: 30.0000 mg | ORAL_CAPSULE | ORAL | 0 refills | Status: DC

## 2023-12-05 NOTE — Progress Notes (Signed)
 Patient ID: Misty Moses, female    DOB: 08/15/2000  MRN: [id number]  CC: Weight Check  Subjective: Misty Moses is a 23 y.o. female who presents for weight check. She is accompanied by her mother.   Her concerns today include:  - Doing well on Phentermine , no issues/concerns.  - States she got into an argument with coworkers and thinks she may lose her job. She plans to schedule an appointment with Psychiatry soon. She denies thoughts of self-harm, suicidal ideations, homicidal ideations.  Patient Active Problem List   Diagnosis Date Noted   Unspecified mood (affective) disorder (HCC) 10/29/2023   History of bipolar disorder 10/27/2023   MDD (major depressive disorder), recurrent episode, moderate (HCC) 08/27/2023   History of ADHD 08/27/2023   Anemia 05/20/2023   Patellofemoral disorders, right knee 03/08/2021   Body mass index (BMI) 50.0-59.9, adult (HCC) 03/08/2021   Obesity 03/08/2021   Pain in right knee 03/06/2021   History of eustachian tube dysfunction 05/04/2020   Abnormal auditory perception of both ears 07/31/2018   Excessive cerumen in both ear canals 06/11/2018   Gait disturbance 10/14/2017   Weakness 10/14/2017   Nonintractable episodic headache 07/16/2017   New ACL tear, right, initial encounter 06/04/2017   Acute medial meniscus tear of right knee 06/04/2017   Cognitive developmental delay 05/19/2017   Callus of foot 03/15/2016   Gastroesophageal reflux disease without esophagitis 09/20/2015   Iron  deficiency anemia secondary to inadequate dietary iron  intake 09/20/2015   Morbid obesity (HCC) 08/25/2015   Generalized anxiety disorder 04/22/2013   Auditory processing disorder 04/22/2013   Severe episode of recurrent major depressive disorder, without psychotic features (HCC) 04/22/2013   Nexplanon insertion 03/16/2013   Contraception 03/16/2013     Current Outpatient Medications on File Prior to Visit  Medication Sig Dispense Refill    bictegravir-emtricitabine -tenofovir  AF (BIKTARVY ) 50-200-25 MG TABS tablet Take 1 tablet by mouth daily. 30 tablet 0   cetirizine  (ZYRTEC  ALLERGY ) 10 MG tablet Take 1 tablet (10 mg total) by mouth daily. 30 tablet 5   escitalopram  (LEXAPRO ) 10 MG tablet Take 1 tablet (10 mg total) by mouth at bedtime. 30 tablet 1   hydrOXYzine  (ATARAX ) 25 MG tablet Take 1 tablet (25 mg total) by mouth 3 (three) times daily as needed for anxiety. 90 tablet 1   ondansetron  (ZOFRAN -ODT) 4 MG disintegrating tablet Take 1 tablet (4 mg total) by mouth every 8 (eight) hours as needed for nausea or vomiting. 20 tablet 0   oxcarbazepine  (TRILEPTAL ) 600 MG tablet Take 1 tablet (600 mg total) by mouth 2 (two) times daily. 60 tablet 1   topiramate  (TOPAMAX ) 50 MG tablet Take 1 tablet (50 mg total) by mouth at bedtime. 30 tablet 1   traZODone  (DESYREL ) 150 MG tablet Take 1 tablet (150 mg total) by mouth at bedtime as needed for sleep. 30 tablet 1   azelastine  (ASTELIN ) 0.1 % nasal spray Place 1 spray into both nostrils 2 (two) times daily as needed. Use in each nostril as directed (Patient not taking: Reported on 11/14/2023) 30 mL 5   EPINEPHrine  (EPIPEN  2-PAK) 0.3 mg/0.3 mL IJ SOAJ injection Inject 0.3 mg into the muscle as needed for anaphylaxis. 2 each 1   fluticasone  (FLONASE ) 50 MCG/ACT nasal spray Place 2 sprays into both nostrils daily. (Patient not taking: Reported on 11/14/2023) 16 g 5   hydrocortisone  2.5 % cream Apply twice daily for flare, maximum 10 days. (Patient not taking: Reported on 11/14/2023) 30 g 1  Iron , Ferrous Sulfate , 325 (65 Fe) MG TABS Take 325 mg by mouth daily. (Patient not taking: Reported on 11/14/2023) 90 tablet 0   Serdexmethylphen-Dexmethylphen (AZSTARYS) 39.2-7.8 MG CAPS Take 1 capsule by mouth daily. (Patient not taking: Reported on 11/14/2023)     No current facility-administered medications on file prior to visit.    Allergies  Allergen Reactions   Hydrocodone Other (See Comments)     Dizziness  hydrocodone   Levonorgestrel-Ethinyl Estrad Other (See Comments)   Pollen Extract Cough    Social History   Socioeconomic History   Marital status: Single    Spouse name: Not on file   Number of children: Not on file   Years of education: Not on file   Highest education level: Not on file  Occupational History   Occupation: Oncologist  Tobacco Use   Smoking status: Never    Passive exposure: Never   Smokeless tobacco: Never  Vaping Use   Vaping status: Never Used  Substance and Sexual Activity   Alcohol use: No   Drug use: No   Sexual activity: Yes    Birth control/protection: Condom  Other Topics Concern   Not on file  Social History Narrative   LIVES WITH MOTHER AND BROTHER   IN 10TH GRADE at [employer]    NORMAL BIRTH HISTORY   She enjoys listening to music, talking, and watching videos   Social Drivers of Health   Financial Resource Strain: Low Risk  (05/19/2023)   Overall Financial Resource Strain (CARDIA)    Difficulty of Paying Living Expenses: Not hard at all  Food Insecurity: No Food Insecurity (05/19/2023)   Hunger Vital Sign    Worried About Running Out of Food in the Last Year: Never true    Ran Out of Food in the Last Year: Never true  Transportation Needs: No Transportation Needs (05/19/2023)   PRAPARE - Administrator, Civil Service (Medical): No    Lack of Transportation (Non-Medical): No  Physical Activity: Insufficiently Active (05/19/2023)   Exercise Vital Sign    Days of Exercise per Week: 2 days    Minutes of Exercise per Session: 20 min  Stress: No Stress Concern Present (05/19/2023)   [employer] of Occupational Health - Occupational Stress Questionnaire    Feeling of Stress : Not at all  Social Connections: Socially Isolated (05/19/2023)   Social Connection and Isolation Panel [NHANES]    Frequency of Communication with Friends and Family: More than three times a week    Frequency of Social  Gatherings with Friends and Family: More than three times a week    Attends Religious Services: Never    Database administrator or Organizations: No    Attends Banker Meetings: Never    Marital Status: Never married  Intimate Partner Violence: Not At Risk (05/19/2023)   Humiliation, Afraid, Rape, and Kick questionnaire    Fear of Current or Ex-Partner: No    Emotionally Abused: No    Physically Abused: No    Sexually Abused: No    Family History  Problem Relation Age of Onset   Hypertension Mother    Migraines Mother    Anxiety disorder Mother    ADD / ADHD Mother    Depression Mother    Bipolar disorder Mother    Seizures Neg Hx    Autism Neg Hx    Schizophrenia Neg Hx     Past Surgical History:  Procedure Laterality Date  ANTERIOR CRUCIATE LIGAMENT REPAIR Right 06/04/2017   Procedure: Right knee arthroscopic hamsting anterior cruciate ligament reconstruction with partial  medial menisectomy;  Surgeon: Janeth Medicus, MD;  Location: [facility];  Service: Orthopedics;  Laterality: Right;   TONSILLECTOMY      ROS: Review of Systems Negative except as stated above  PHYSICAL EXAM: BP 130/85   Pulse 90   Temp 98.4 F (36.9 C) (Oral)   Resp 18   Ht 5' 4.5" (1.638 m)   Wt (!) 304 lb 12.8 oz (138.3 kg)   LMP 11/27/2023 (Approximate)   SpO2 98%   BMI 51.51 kg/m   Physical Exam HENT:     Head: Normocephalic and atraumatic.     Nose: Nose normal.     Mouth/Throat:     Mouth: Mucous membranes are moist.     Pharynx: Oropharynx is clear.  Eyes:     Extraocular Movements: Extraocular movements intact.     Conjunctiva/sclera: Conjunctivae normal.     Pupils: Pupils are equal, round, and reactive to light.  Cardiovascular:     Rate and Rhythm: Normal rate and regular rhythm.     Pulses: Normal pulses.     Heart sounds: Normal heart sounds.  Pulmonary:     Effort: Pulmonary effort is normal.     Breath sounds: Normal breath  sounds.  Musculoskeletal:        General: Normal range of motion.     Cervical back: Normal range of motion and neck supple.  Neurological:     General: No focal deficit present.     Mental Status: She is alert and oriented to person, place, and time.  Psychiatric:        Mood and Affect: Mood normal.        Behavior: Behavior normal.     ASSESSMENT AND PLAN: 1. Encounter for weight management (Primary) 2. BMI 50.0-59.9, adult (HCC) - Increase Phentermine  from 15 mg to 30 mg as prescribed. Counseled on medication adherence/adverse effects. - Follow-up with primary provider in 4 weeks or sooner if needed. - phentermine  30 MG capsule; Take 1 capsule (30 mg total) by mouth every morning.  Dispense: 30 capsule; Refill: 0    Patient was given the opportunity to ask questions.  Patient verbalized understanding of the plan and was able to repeat key elements of the plan. Patient was given clear instructions to go to Emergency Department or return to medical center if symptoms don't improve, worsen, or new problems develop.The patient verbalized understanding.  Requested Prescriptions   Signed Prescriptions Disp Refills   phentermine  30 MG capsule 30 capsule 0    Sig: Take 1 capsule (30 mg total) by mouth every morning.    Return in about 4 weeks (around 01/02/2024) for Follow-Up or next available weight check.  Senaida Dama, NP
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
## 2023-12-09 ENCOUNTER — Telehealth: Payer: Self-pay

## 2023-12-09 NOTE — Telephone Encounter (Signed)
 Patient called with questions about why she was referred to our office. Reviewed that at her ED visit she was offered Biktarvy  for PEP. She declined at that time. She is agreeable to schedule appointment with [facility] for follow up HIV testing. Call transferred to front desk for scheduling.   Jamarquis Crull, BSN, RN

## 2023-12-10 ENCOUNTER — Ambulatory Visit (INDEPENDENT_AMBULATORY_CARE_PROVIDER_SITE_OTHER): Admitting: Pharmacist

## 2023-12-10 ENCOUNTER — Other Ambulatory Visit: Payer: Self-pay

## 2023-12-10 DIAGNOSIS — Z2981 Encounter for HIV pre-exposure prophylaxis: Secondary | ICD-10-CM

## 2023-12-10 DIAGNOSIS — Z206 Contact with and (suspected) exposure to human immunodeficiency virus [HIV]: Secondary | ICD-10-CM

## 2023-12-10 DIAGNOSIS — T7421XD Adult sexual abuse, confirmed, subsequent encounter: Secondary | ICD-10-CM

## 2023-12-10 NOTE — Progress Notes (Signed)
 HPI: Misty Moses is a 23 y.o. female who presents to the [facility] pharmacy clinic for HIV PEP follow-up.  Insured   [x]    Uninsured  []    Patient Active Problem List   Diagnosis Date Noted   Unspecified mood (affective) disorder (HCC) 10/29/2023   History of bipolar disorder 10/27/2023   MDD (major depressive disorder), recurrent episode, moderate (HCC) 08/27/2023   History of ADHD 08/27/2023   Anemia 05/20/2023   Patellofemoral disorders, right knee 03/08/2021   Body mass index (BMI) 50.0-59.9, adult (HCC) 03/08/2021   Obesity 03/08/2021   Pain in right knee 03/06/2021   History of eustachian tube dysfunction 05/04/2020   Abnormal auditory perception of both ears 07/31/2018   Excessive cerumen in both ear canals 06/11/2018   Gait disturbance 10/14/2017   Weakness 10/14/2017   Nonintractable episodic headache 07/16/2017   New ACL tear, right, initial encounter 06/04/2017   Acute medial meniscus tear of right knee 06/04/2017   Cognitive developmental delay 05/19/2017   Callus of foot 03/15/2016   Gastroesophageal reflux disease without esophagitis 09/20/2015   Iron  deficiency anemia secondary to inadequate dietary iron  intake 09/20/2015   Morbid obesity (HCC) 08/25/2015   Generalized anxiety disorder 04/22/2013   Auditory processing disorder 04/22/2013   Severe episode of recurrent major depressive disorder, without psychotic features (HCC) 04/22/2013   Nexplanon insertion 03/16/2013   Contraception 03/16/2013    Patient's Medications  New Prescriptions   No medications on file  Previous Medications   AZELASTINE  (ASTELIN ) 0.1 % NASAL SPRAY    Place 1 spray into both nostrils 2 (two) times daily as needed. Use in each nostril as directed   BICTEGRAVIR-EMTRICITABINE -TENOFOVIR  AF (BIKTARVY ) 50-200-25 MG TABS TABLET    Take 1 tablet by mouth daily.   CETIRIZINE  (ZYRTEC  ALLERGY ) 10 MG TABLET    Take 1 tablet (10 mg total) by mouth daily.   EPINEPHRINE  (EPIPEN  2-PAK) 0.3  MG/0.3 ML IJ SOAJ INJECTION    Inject 0.3 mg into the muscle as needed for anaphylaxis.   ESCITALOPRAM  (LEXAPRO ) 10 MG TABLET    Take 1 tablet (10 mg total) by mouth at bedtime.   FLUTICASONE  (FLONASE ) 50 MCG/ACT NASAL SPRAY    Place 2 sprays into both nostrils daily.   HYDROCORTISONE  2.5 % CREAM    Apply twice daily for flare, maximum 10 days.   HYDROXYZINE  (ATARAX ) 25 MG TABLET    Take 1 tablet (25 mg total) by mouth 3 (three) times daily as needed for anxiety.   IRON , FERROUS SULFATE , 325 (65 FE) MG TABS    Take 325 mg by mouth daily.   ONDANSETRON  (ZOFRAN -ODT) 4 MG DISINTEGRATING TABLET    Take 1 tablet (4 mg total) by mouth every 8 (eight) hours as needed for nausea or vomiting.   OXCARBAZEPINE  (TRILEPTAL ) 600 MG TABLET    Take 1 tablet (600 mg total) by mouth 2 (two) times daily.   PHENTERMINE  30 MG CAPSULE    Take 1 capsule (30 mg total) by mouth every morning.   SERDEXMETHYLPHEN-DEXMETHYLPHEN (AZSTARYS) 39.2-7.8 MG CAPS    Take 1 capsule by mouth daily.   TOPIRAMATE  (TOPAMAX ) 50 MG TABLET    Take 1 tablet (50 mg total) by mouth at bedtime.   TRAZODONE  (DESYREL ) 150 MG TABLET    Take 1 tablet (150 mg total) by mouth at bedtime as needed for sleep.  Modified Medications   No medications on file  Discontinued Medications   No medications on file  No data to display          Labs:  SCr: Lab Results  Component Value Date   CREATININE 0.93 11/23/2023   CREATININE 1.10 (H) 05/19/2023   CREATININE 0.93 05/24/2021   CREATININE 1.05 (H) 12/04/2017   CREATININE 0.94 12/01/2017   HIV Lab Results  Component Value Date   HIV NON REACTIVE 11/23/2023   HIV Non Reactive 11/14/2023   HIV Non Reactive 05/13/2023   HIV Non Reactive 09/04/2022   HIV Non Reactive 10/14/2017   Hepatitis B Lab Results  Component Value Date   HEPBSAG NON REACTIVE 11/23/2023   Hepatitis C No results found for: "HEPCAB", "HCVRNAPCRQN" Hepatitis A No results found for: "HAV" RPR and STI Lab  Results  Component Value Date   LABRPR NON REACTIVE 11/23/2023   LABRPR Non Reactive 11/14/2023   LABRPR Non Reactive 05/13/2023   LABRPR Non Reactive 09/04/2022    STI Results GC GC CT CT  11/14/2023  8:52 AM Negative   Positive    05/13/2023  1:17 PM Negative   Negative    09/04/2022  7:40 PM Positive   Negative    03/16/2013 11:35 AM  NEGATIVE   NEGATIVE     Assessment: Misty Moses presents today for follow up after a potential exposure to HIV in late April. Discussed that after her event she was tested and empirically treated for common STIs. She deferred PEP at that time.   Given it has been around 2 weeks since her event, it is too soon to check an HIV antibody as her immune system may not have had time to create antibodies yet. Can check an HIV RNA today as it is more sensitive though could still be a false negative. She denies any signs or symptoms of acute HIV including flu-like symptoms, fever, fatigue, etc. Counseled her to continue to monitor for these symptoms and if they occur to reach out to our clinic.   Briefly discussed the option of PrEP if she has had past exposures or has concern for future exposures. She feels as though this was a one-time issue and is not interested at this time.   She previously tested positive for and treated for chlamydia and trichomoniasis before her event in April and is requesting repeat urine STI testing today for peace of mind.  Plan: - HIV RNA and urine cytologies today - Scheduled follow up to check HIV Ab on 7/2 with Cassie - Follow up results and need for treatment - Call or message with any questions   Valarie Garner, PharmD PGY1 Pharmacy Resident

## 2023-12-10 NOTE — Progress Notes (Signed)
 NEW REFERRAL TO [facility]PP CLINIC

## 2023-12-11 LAB — C. TRACHOMATIS/N. GONORRHOEAE RNA
C. trachomatis RNA, TMA: NOT DETECTED
N. gonorrhoeae RNA, TMA: NOT DETECTED

## 2023-12-11 LAB — TRICHOMONAS VAGINALIS, PROBE AMP: Trichomonas vaginalis RNA: NOT DETECTED

## 2023-12-12 LAB — HIV-1 RNA QUANT-NO REFLEX-BLD
HIV 1 RNA Quant: NOT DETECTED {copies}/mL
HIV-1 RNA Quant, Log: NOT DETECTED {Log_copies}/mL

## 2023-12-19 ENCOUNTER — Encounter (HOSPITAL_COMMUNITY): Payer: Self-pay

## 2023-12-19 ENCOUNTER — Ambulatory Visit (INDEPENDENT_AMBULATORY_CARE_PROVIDER_SITE_OTHER): Admitting: Licensed Clinical Social Worker

## 2023-12-19 DIAGNOSIS — F411 Generalized anxiety disorder: Secondary | ICD-10-CM | POA: Diagnosis not present

## 2023-12-19 DIAGNOSIS — F331 Major depressive disorder, recurrent, moderate: Secondary | ICD-10-CM | POA: Diagnosis not present

## 2023-12-19 DIAGNOSIS — Z8659 Personal history of other mental and behavioral disorders: Secondary | ICD-10-CM | POA: Diagnosis not present
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
## 2024-01-02 ENCOUNTER — Ambulatory Visit (HOSPITAL_COMMUNITY): Admitting: Licensed Clinical Social Worker

## 2024-01-02 DIAGNOSIS — F411 Generalized anxiety disorder: Secondary | ICD-10-CM | POA: Diagnosis not present

## 2024-01-02 DIAGNOSIS — F39 Unspecified mood [affective] disorder: Secondary | ICD-10-CM | POA: Diagnosis not present

## 2024-01-02 DIAGNOSIS — F331 Major depressive disorder, recurrent, moderate: Secondary | ICD-10-CM

## 2024-01-02 NOTE — Progress Notes (Signed)
 THERAPIST PROGRESS NOTE   Session Date: 01/02/2024  Session Time: 1105 - 1140 Virtual Visit via Video Note  I connected with Misty Moses on 01/02/24 at 11:00 AM EDT by a video enabled telemedicine application and verified that I am speaking with the correct person using two identifiers.  Location: Patient: Home Provider: Home Office   I discussed the limitations of evaluation and management by telemedicine and the availability of in person appointments. The patient expressed understanding and agreed to proceed.   The patient was advised to call back or seek an in-person evaluation if the symptoms worsen or if the condition fails to improve as anticipated.  I provided 35 minutes of non-face-to-face time during this encounter.  Participation Level: Active  Behavioral Response: CasualAlertAnxious, Depressed, and Euthymic  Type of Therapy: Individual Therapy  Treatment Goals addressed:   Initial (5) - Increase coping skills to manage depression and improve ability to perform daily activities (OP Depression) - Misty Moses will identify cognitive patterns and beliefs that support depression (OP Depression) - "Improve the management of my moods and depression so that I am able to interact with others better" (OP Depression) - Report a decrease in anxiety symptoms as evidenced by an overall reduction in anxiety score by a minimum of 25% on the Generalized Anxiety Disorder Scale (GAD-7) (Anxiety) - "Find ways to calm myself down when my anxiety becomes overwhelming" (Anxiety)  Progress Towards Goals: Not Progressing  Interventions: CBT, Motivational Interviewing, Solution Focused, and Supportive  Summary: Misty Moses is a 23 y.o. female with psych history of MDD, GAD, and hx of Bipolar, presenting for follow-up therapy session in efforts to improve management of anxious, depressive, and mood dysregulation symptoms.   Patient actively engaged in session, presenting in variable moods, proving  please for majority of visit, with periods of increased anxiousness and depressed moods when exploring presenting stressors and challenges, and congruent affect throughout. Patient actively engaged in introductory check-in, sharing of having gotten a new job, and anticipate being evicted in the next week due to being two months behind, further engaging in exploration of rental assistance resources through [employer] and [employer], requirements for support via such programs having been learned by pt and/or mother, and possible opportunities of resources available via housing authority. Actively engaged in reassessing presenting depressive and anxious sxs via PHQ-9 and GAD-7, further processing significant reductions in both areas in comparison to previous weeks. Pt further reflected improvements in moods and less stress since leaving previous job. Further processed additional stressors related to ongoing court case related to prior employment, exploring pt's interests in increasing efforts to connect with public defender to process potential action steps pt may take to support in resolution of case. Explored need to connect with office to schedule f/u appt for med man to ensure maintaining medication regimen as prescribed.  Patient responded well to interventions. Patient continues to meet criteria for MDD, GAD, and hx of Bipolar,. Patient will continue to benefit from engagement in outpatient therapy due to being the least restrictive service to meet presenting needs.      01/02/2024   11:35 AM 12/19/2023   10:27 AM 12/05/2023    2:53 PM 11/28/2023    8:11 AM  GAD 7 : Generalized Anxiety Score  Nervous, Anxious, on Edge 0 3 3 3   Control/stop worrying 2 3 2 1   Worry too much - different things 0 1 3 2   Trouble relaxing 0 1 1 1   Restless 0 1 1 0  Easily  annoyed or irritable 0 2 2 1   Afraid - awful might happen 0 1 3 1   Total GAD 7 Score 2 12 15 9   Anxiety Difficulty Not difficult at all Somewhat  difficult Very difficult Very difficult      01/02/2024   11:37 AM 12/19/2023   10:29 AM 12/05/2023    2:52 PM 11/28/2023    8:09 AM 11/07/2023    3:40 PM  Depression screen PHQ 2/9  Decreased Interest 0 0 0 0 1  Down, Depressed, Hopeless 0 3 2 1 2   PHQ - 2 Score 0 3 2 1 3   Altered sleeping 0 1 3 0 0  Tired, decreased energy 0 0 2 2 0  Change in appetite 0 2 2 1  0  Feeling bad or failure about yourself  0 1 1 1  0  Trouble concentrating 0 1 1 1 2   Moving slowly or fidgety/restless 0 0 1 2 0  Suicidal thoughts 0 0 0 0 0  PHQ-9 Score 0 8 12 8 5   Difficult doing work/chores  Very difficult Very difficult Very difficult Somewhat difficult   Flowsheet Row Counselor from 11/28/2023 in [facility] ED from 11[facility]23/2023 in [facility] at [facility] UC from 11/14/2023 in [facility] at [facility])  C-SSRS RISK CATEGORY No Risk No Risk No Risk        Suicidal/Homicidal: Nowithout intent/plan  Therapist Response:  Clinician utilized CBT, MI, solution focused, and supportive reflection interventions to support patient in efforts to address presenting sxs and challenges surrounding presenting stressors.  Clinician openly greeted pt upon joining today's virtual visit, actively engaged pt in introductory check-in, assessing presenting moods and affect, and further eliciting recounts of recent events, and factors contributing to presenting moods.  Actively listened to patient's reflections of recent events, providing support, validation, and encouragement for expressed progressions and patient identified successes and securing employment.  Provided support and assisted in exploration of possible resources available to patient to aid in addressing rental needs and anticipated eviction.  Utilized open-ended questions to further support patient in exploration of recent events and patient's individual efforts at  managing presenting challenges.  Engage patient in reassessing presenting depressive and anxious symptoms via PHQ-9 and GAD-7, noting of significant reduction in scores, engaging further in review of presenting stressors behavioral activation methods patient may prove to utilize as means of proactively addressing concerns.  Clinician reassessed severity of presenting sxs, and presence of any safety concerns. Clinician provided support and empathy to patient during session.  Plan: Return again in 2 weeks.  Diagnosis:  Encounter Diagnoses  Name Primary?   MDD (major depressive disorder), recurrent episode, moderate (HCC) Yes   GAD (generalized anxiety disorder)    Unspecified mood (affective) disorder (HCC)      Collaboration of Care: Psychiatrist AEB provider documentation available in EHR.  Patient/Guardian was advised Release of Information must be obtained prior to any record release in order to collaborate their care with an outside provider. Patient/Guardian was advised if they have not already done so to contact the registration department to sign all necessary forms in order for us  to release information regarding their care.   Consent: Patient/Guardian gives verbal consent for treatment and assignment of benefits for services provided during this visit. Patient/Guardian expressed understanding and agreed to proceed.   Patsi Boots, MSW, LCSW 01/02/2024,  11:38 AM

## 2024-01-07 ENCOUNTER — Other Ambulatory Visit (HOSPITAL_COMMUNITY): Payer: Self-pay | Admitting: Student

## 2024-01-07 DIAGNOSIS — Z8659 Personal history of other mental and behavioral disorders: Secondary | ICD-10-CM

## 2024-01-07 DIAGNOSIS — F411 Generalized anxiety disorder: Secondary | ICD-10-CM

## 2024-01-07 DIAGNOSIS — F331 Major depressive disorder, recurrent, moderate: Secondary | ICD-10-CM

## 2024-01-07 MED ORDER — OXCARBAZEPINE 600 MG PO TABS: 600.0000 mg | ORAL_TABLET | Freq: Two times a day (BID) | ORAL | 1 refills | Status: DC

## 2024-01-07 MED ORDER — TRAZODONE HCL 150 MG PO TABS
150.0000 mg | ORAL_TABLET | Freq: Every evening | ORAL | 1 refills | Status: DC | PRN
Start: 2024-01-07 — End: 2024-03-03

## 2024-01-07 MED ORDER — TOPIRAMATE 50 MG PO TABS: 50.0000 mg | ORAL_TABLET | Freq: Every day | ORAL | 1 refills | Status: DC

## 2024-01-07 MED ORDER — HYDROXYZINE HCL 25 MG PO TABS: 25.0000 mg | ORAL_TABLET | Freq: Three times a day (TID) | ORAL | 1 refills | Status: DC | PRN

## 2024-01-07 MED ORDER — ESCITALOPRAM OXALATE 10 MG PO TABS: 10.0000 mg | ORAL_TABLET | Freq: Every day | ORAL | 1 refills | Status: DC

## 2024-01-16 ENCOUNTER — Ambulatory Visit (HOSPITAL_COMMUNITY): Admitting: Licensed Clinical Social Worker

## 2024-01-16 DIAGNOSIS — F411 Generalized anxiety disorder: Secondary | ICD-10-CM | POA: Diagnosis not present

## 2024-01-16 DIAGNOSIS — F331 Major depressive disorder, recurrent, moderate: Secondary | ICD-10-CM | POA: Diagnosis not present
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
## 2024-01-19 ENCOUNTER — Ambulatory Visit (HOSPITAL_COMMUNITY): Admitting: Family

## 2024-01-26 ENCOUNTER — Encounter (HOSPITAL_COMMUNITY): Payer: Self-pay

## 2024-01-26 ENCOUNTER — Ambulatory Visit (INDEPENDENT_AMBULATORY_CARE_PROVIDER_SITE_OTHER): Admitting: Licensed Clinical Social Worker

## 2024-01-26 DIAGNOSIS — Z91199 Patient's noncompliance with other medical treatment and regimen due to unspecified reason: Secondary | ICD-10-CM

## 2024-01-26 NOTE — Progress Notes (Signed)
 THERAPIST PROGRESS NOTE   Session Date: 01/26/2024  Session Time: 0900  Misty Moses    Clinician attempted to connect with patient for scheduled appointment via Caregility video, sending text request x3 with no response.     Attempt 1: Text: 0908   Attempt 2: Text: 0910    Attempt 3: Text: 0911   Disconnected video visit at:  9086     Per  policy, after multiple attempts to reach patient unsuccessfully at appointed time, visit will be coded as a no show.  Misty Moses, MSW, LCSW 01/26/2024,  9:13 AM

## 2024-01-27 NOTE — Progress Notes (Deleted)
 01/27/2024  HPI: SHANELL ADEN is a 23 y.o. female who presents to the [facility] clinic today to follow up for PEP.  Patient Active Problem List   Diagnosis Date Noted   Unspecified mood (affective) disorder (HCC) 10/29/2023   History of bipolar disorder 10/27/2023   MDD (major depressive disorder), recurrent episode, moderate (HCC) 08/27/2023   History of ADHD 08/27/2023   Anemia 05/20/2023   Patellofemoral disorders, right knee 03/08/2021   Body mass index (BMI) 50.0-59.9, adult (HCC) 03/08/2021   Obesity 03/08/2021   Pain in right knee 03/06/2021   History of eustachian tube dysfunction 05/04/2020   Abnormal auditory perception of both ears 07/31/2018   Excessive cerumen in both ear canals 06/11/2018   Gait disturbance 10/14/2017   Weakness 10/14/2017   Nonintractable episodic headache 07/16/2017   New ACL tear, right, initial encounter 06/04/2017   Acute medial meniscus tear of right knee 06/04/2017   Cognitive developmental delay 05/19/2017   Callus of foot 03/15/2016   Gastroesophageal reflux disease without esophagitis 09/20/2015   Iron  deficiency anemia secondary to inadequate dietary iron  intake 09/20/2015   Morbid obesity (HCC) 08/25/2015   Generalized anxiety disorder 04/22/2013   Auditory processing disorder 04/22/2013   Severe episode of recurrent major depressive disorder, without psychotic features (HCC) 04/22/2013   Nexplanon insertion 03/16/2013   Contraception 03/16/2013    Patient's Medications  New Prescriptions   No medications on file  Previous Medications   AZELASTINE  (ASTELIN ) 0.1 % NASAL SPRAY    Place 1 spray into both nostrils 2 (two) times daily as needed. Use in each nostril as directed   CETIRIZINE  (ZYRTEC  ALLERGY ) 10 MG TABLET    Take 1 tablet (10 mg total) by mouth daily.   EPINEPHRINE  (EPIPEN  2-PAK) 0.3 MG/0.3 ML IJ SOAJ INJECTION    Inject 0.3 mg into the muscle as needed for anaphylaxis.   ESCITALOPRAM  (LEXAPRO ) 10 MG TABLET    Take 1  tablet (10 mg total) by mouth at bedtime.   FLUTICASONE  (FLONASE ) 50 MCG/ACT NASAL SPRAY    Place 2 sprays into both nostrils daily.   HYDROCORTISONE  2.5 % CREAM    Apply twice daily for flare, maximum 10 days.   HYDROXYZINE  (ATARAX ) 25 MG TABLET    Take 1 tablet (25 mg total) by mouth 3 (three) times daily as needed for anxiety.   IRON , FERROUS SULFATE , 325 (65 FE) MG TABS    Take 325 mg by mouth daily.   ONDANSETRON  (ZOFRAN -ODT) 4 MG DISINTEGRATING TABLET    Take 1 tablet (4 mg total) by mouth every 8 (eight) hours as needed for nausea or vomiting.   OXCARBAZEPINE  (TRILEPTAL ) 600 MG TABLET    Take 1 tablet (600 mg total) by mouth 2 (two) times daily.   PHENTERMINE  30 MG CAPSULE    Take 1 capsule (30 mg total) by mouth every morning.   SERDEXMETHYLPHEN-DEXMETHYLPHEN (AZSTARYS) 39.2-7.8 MG CAPS    Take 1 capsule by mouth daily.   TOPIRAMATE  (TOPAMAX ) 50 MG TABLET    Take 1 tablet (50 mg total) by mouth at bedtime.   TRAZODONE  (DESYREL ) 150 MG TABLET    Take 1 tablet (150 mg total) by mouth at bedtime as needed for sleep.  Modified Medications   No medications on file  Discontinued Medications   No medications on file    Assessment: Christyl comes in today for HIV testing after a potential exposure in late April. She was seen in the ED after event but declined PEP at  that time. We saw her in clinic on 5/14 where her HIV RNA was negative. She was also negative for any STIs. She was not interested in PrEP at that visit and is still not interested today. Will recheck HIV test today to ensure she is negative.   Plan: - HIV RNA today - Follow up in 3 months for one more HIV test  Alistar Mcenery L. Kierre Deines, PharmD, BCIDP, AAHIVP, CPP Clinical Pharmacist Practitioner - Infectious Diseases Clinical Pharmacist Lead - [facility] for Infectious Disease 01/27/2024, 3:17 PM

## 2024-01-28 ENCOUNTER — Ambulatory Visit: Admitting: Pharmacist

## 2024-01-28 DIAGNOSIS — Z206 Contact with and (suspected) exposure to human immunodeficiency virus [HIV]: Secondary | ICD-10-CM

## 2024-01-28 DIAGNOSIS — T7421XD Adult sexual abuse, confirmed, subsequent encounter: Secondary | ICD-10-CM
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
## 2024-02-06 ENCOUNTER — Ambulatory Visit (INDEPENDENT_AMBULATORY_CARE_PROVIDER_SITE_OTHER): Admitting: Licensed Clinical Social Worker

## 2024-02-06 DIAGNOSIS — F411 Generalized anxiety disorder: Secondary | ICD-10-CM | POA: Diagnosis not present

## 2024-02-06 DIAGNOSIS — F331 Major depressive disorder, recurrent, moderate: Secondary | ICD-10-CM

## 2024-02-06 DIAGNOSIS — Z8659 Personal history of other mental and behavioral disorders: Secondary | ICD-10-CM | POA: Diagnosis not present

## 2024-02-06 NOTE — Progress Notes (Unsigned)
 THERAPIST PROGRESS NOTE   Session Date: 02/06/2024  Session Time: 1111 - 1150 Virtual Visit via Video Note  I connected with Misty Moses on 02/06/24 at 11:00 AM EDT by a video enabled telemedicine application and verified that I am speaking with the correct person using two identifiers.  Location: Patient: Home Provider: Home Office   I discussed the limitations of evaluation and management by telemedicine and the availability of in person appointments. The patient expressed understanding and agreed to proceed.   The patient was advised to call back or seek an in-person evaluation if the symptoms worsen or if the condition fails to improve as anticipated.  I provided 39 minutes of non-face-to-face time during this encounter.  Participation Level: Active  Behavioral Response: CasualAlertDepressed and Euthymic  Type of Therapy: Individual Therapy  Treatment Goals addressed:   Initial (5) - Increase coping skills to manage depression and improve ability to perform daily activities (OP Depression) - Keeli will identify cognitive patterns and beliefs that support depression (OP Depression) - Improve the management of my moods and depression so that I am able to interact with others better (OP Depression) - Report a decrease in anxiety symptoms as evidenced by an overall reduction in anxiety score by a minimum of 25% on the Generalized Anxiety Disorder Scale (GAD-7) (Anxiety) - Find ways to calm myself down when my anxiety becomes overwhelming (Anxiety)  Progress Towards Goals: Progressing  Interventions: CBT, Motivational Interviewing, Solution Focused, and Supportive  Summary: Misty Moses is a 23 y.o. female with psych history of MDD, GAD, and hx of Bipolar, presenting for follow-up therapy session in efforts to improve management of anxious, depressive, and mood dysregulation symptoms.   Patient actively engaged in session, presenting in depressive moods, and congruent  affect throughout. Patient actively engaged in introductory check-in, sharing of feeling tired today, briefly detailing recent events, sharing of having secured a new apartment. Reassessed presenting depressive and anxious sxs via PHQ-9 and GAD-7, noting of increased scores and observed sxs, further detailing of financial stress due to having recently moved to a new apartment, stress surrounding spending most money on bills, and not meeting requirements for food pantry supports. Pt further detailed of having moved yesterday, traveling between new apartment and previous residence, moving items, acknowledging moving and catching up with bills and car payments being primary stressors currently. Explored pt's efforts at managing stressors, sharing of utilizing positive self-talk, words of affirmation, motivational quotes, and cleaning proving to be helpful.  Patient responded well to interventions. Patient continues to meet criteria for MDD, GAD, and hx of Bipolar,. Patient will continue to benefit from engagement in outpatient therapy due to being the least restrictive service to meet presenting needs.      02/06/2024   11:15 AM 01/16/2024   11:31 AM 01/02/2024   11:35 AM 12/19/2023   10:27 AM  GAD 7 : Generalized Anxiety Score  Nervous, Anxious, on Edge 1 1 0 3  Control/stop worrying 1 1 2 3   Worry too much - different things 1 0 0 1  Trouble relaxing 0 0 0 1  Restless 0 0 0 1  Easily annoyed or irritable 1 1 0 2  Afraid - awful might happen 1 0 0 1  Total GAD 7 Score 5 3 2 12   Anxiety Difficulty Not difficult at all Not difficult at all Not difficult at all Somewhat difficult      02/06/2024   11:18 AM 01/16/2024   11:34 AM 01/02/2024   11:37 AM  12/19/2023   10:29 AM 12/05/2023    2:52 PM  Depression screen PHQ 2/9  Decreased Interest 0 0 0 0 0  Down, Depressed, Hopeless 0 0 0 3 2  PHQ - 2 Score 0 0 0 3 2  Altered sleeping 1 0 0 1 3  Tired, decreased energy 1 0 0 0 2  Change in appetite 3 0 0 2  2  Feeling bad or failure about yourself  0 0 0 1 1  Trouble concentrating 0 0 0 1 1  Moving slowly or fidgety/restless 0 0 0 0 1  Suicidal thoughts 0 0 0 0 0  PHQ-9 Score 5 0 0 8 12  Difficult doing work/chores Not difficult at all   Very difficult Very difficult   Suicidal/Homicidal: Nowithout intent/plan  Therapist Response:  Clinician utilized CBT, MI, solution focused, and supportive reflection interventions to support patient in efforts to address presenting sxs and challenges surrounding presenting stressors.  Clinician actively greeted patient upon joining today's virtual visit, assessing presenting moods and affect, and engaging in introductory check-in. Actively listened to patient's brief reflections of the events and factors contributing to presenting moods, providing support and validation for expressed feelings. Engaged in reassessing presenting depressive and anxious symptoms via PHQ-9 and GAD-7, further supporting patient and processing increase in scaling scores, utilizing open-ended questions to further explore patient's individual perspectives and awareness of increased symptoms and contributing factors. Further supported patient in exploring ongoing stressors, challenges at navigating such, and individual efforts at managing stress and thoughts and feelings related to such. Utilized psycho Ed interventions to aid patient in processing individual tools and techniques at managing stress.  Clinician reassessed severity of presenting sxs, and presence of any safety concerns. Clinician provided support and empathy to patient during session.  Plan: Return again in 2 weeks.  Diagnosis:  Encounter Diagnoses  Name Primary?   MDD (major depressive disorder), recurrent episode, moderate (HCC) Yes   GAD (generalized anxiety disorder)    History of bipolar disorder      Collaboration of Care: Psychiatrist AEB provider documentation available in EHR.  Patient/Guardian was  advised Release of Information must be obtained prior to any record release in order to collaborate their care with an outside provider. Patient/Guardian was advised if they have not already done so to contact the registration department to sign all necessary forms in order for us  to release information regarding their care.   Consent: Patient/Guardian gives verbal consent for treatment and assignment of benefits for services provided during this visit. Patient/Guardian expressed understanding and agreed to proceed.   Lynwood JONETTA Maris, MSW, LCSW 02/06/2024,  11:24 AM

## 2024-02-11 ENCOUNTER — Ambulatory Visit (HOSPITAL_COMMUNITY)

## 2024-02-20 ENCOUNTER — Ambulatory Visit (INDEPENDENT_AMBULATORY_CARE_PROVIDER_SITE_OTHER): Admitting: Licensed Clinical Social Worker

## 2024-02-20 ENCOUNTER — Encounter (HOSPITAL_COMMUNITY): Payer: Self-pay

## 2024-02-20 DIAGNOSIS — Z91199 Patient's noncompliance with other medical treatment and regimen due to unspecified reason: Secondary | ICD-10-CM

## 2024-02-20 NOTE — Progress Notes (Signed)
 THERAPIST PROGRESS NOTE   Session Date: 02/20/2024  Session Time: 0800  [name]    Clinician attempted to connect with patient for scheduled appointment via Careg[facility] video, sending text request x3 with no response.     Attempt 1: Text: 0806   Attempt 2: Text: 9191    Attempt 3: Text: 0809   Disconnected video visit at:  9188   Today's missed appt is pt's 2nd no-show, including 01/26/24. LCSW will reiterate attendance requirements at next scheduled visit.    Per [facility] policy, after multiple attempts to reach patient unsuccessfully at appointed time, visit will be coded as a no show.  Lynwood JONETTA Maris, MSW, LCSW 02/20/2024,  8:11 AM

## 2024-02-28 ENCOUNTER — Other Ambulatory Visit: Payer: Self-pay | Admitting: Family

## 2024-02-28 DIAGNOSIS — Z7689 Persons encountering health services in other specified circumstances: Secondary | ICD-10-CM

## 2024-02-28 DIAGNOSIS — Z6841 Body Mass Index (BMI) 40.0 and over, adult: Secondary | ICD-10-CM

## 2024-03-03 ENCOUNTER — Other Ambulatory Visit (HOSPITAL_COMMUNITY): Payer: Self-pay

## 2024-03-03 DIAGNOSIS — Z8659 Personal history of other mental and behavioral disorders: Secondary | ICD-10-CM

## 2024-03-03 DIAGNOSIS — F331 Major depressive disorder, recurrent, moderate: Secondary | ICD-10-CM

## 2024-03-03 DIAGNOSIS — F411 Generalized anxiety disorder: Secondary | ICD-10-CM

## 2024-03-03 MED ORDER — OXCARBAZEPINE 600 MG PO TABS: 600.0000 mg | ORAL_TABLET | Freq: Two times a day (BID) | ORAL | 0 refills | Status: DC

## 2024-03-03 MED ORDER — ESCITALOPRAM OXALATE 10 MG PO TABS: 10.0000 mg | ORAL_TABLET | Freq: Every day | ORAL | 0 refills | Status: DC

## 2024-03-03 MED ORDER — HYDROXYZINE HCL 25 MG PO TABS
25.0000 mg | ORAL_TABLET | Freq: Three times a day (TID) | ORAL | 0 refills | Status: DC | PRN
Start: 2024-03-03 — End: 2024-03-15

## 2024-03-03 MED ORDER — TRAZODONE HCL 150 MG PO TABS: 150.0000 mg | ORAL_TABLET | Freq: Every evening | ORAL | 0 refills | Status: DC | PRN

## 2024-03-03 MED ORDER — TOPIRAMATE 50 MG PO TABS: 50.0000 mg | ORAL_TABLET | Freq: Every day | ORAL | 0 refills | Status: DC

## 2024-03-05 ENCOUNTER — Ambulatory Visit (HOSPITAL_COMMUNITY): Admitting: Licensed Clinical Social Worker

## 2024-03-08 ENCOUNTER — Other Ambulatory Visit: Payer: Self-pay | Admitting: Family

## 2024-03-08 DIAGNOSIS — Z7689 Persons encountering health services in other specified circumstances: Secondary | ICD-10-CM

## 2024-03-08 DIAGNOSIS — Z6841 Body Mass Index (BMI) 40.0 and over, adult: Secondary | ICD-10-CM

## 2024-03-08 NOTE — Telephone Encounter (Unsigned)
 Copied from CRM [id number]. Topic: Clinical - Medication Refill >> Mar 08, 2024  1:36 PM Montie POUR wrote: Medication: phentermine  30 MG capsule  Has the patient contacted their pharmacy? Yes (Agent: If no, request that the patient contact the pharmacy for the refill. If patient does not wish to contact the pharmacy document the reason why and proceed with request.) (Agent: If yes, when and what did the pharmacy advise?) Pharmacy needs order to refill  This is the patient's preferred pharmacy:  [facility]  [address] [place], [place] [address]  [phone] Is this the correct pharmacy for this prescription? Yes If no, delete pharmacy and type the correct one.   Has the prescription been filled recently? Yes  Is the patient out of the medication? Yes  Has the patient been seen for an appointment in the last year OR does the patient have an upcoming appointment? Yes  Can we respond through MyChart? Yes  Agent: Please be advised that Rx refills may take up to 3 business days. We ask that you follow-up with your pharmacy.

## 2024-03-11 NOTE — Telephone Encounter (Signed)
 Requested medication (s) are due for refill today: YES  Requested medication (s) are on the active medication list: YES  Last refill:  12/05/23 #30  Future visit scheduled: YES  Notes to clinic:  med. Not delegated to NT to RF   Requested Prescriptions  Pending Prescriptions Disp Refills   phentermine  30 MG capsule 30 capsule 0    Sig: Take 1 capsule (30 mg total) by mouth every morning.     Not Delegated - Neurology: Anticonvulsants - Controlled - phentermine  hydrochloride Failed - 03/11/2024  1:19 PM      Failed - This refill cannot be delegated      Failed - Weight completed in the last 3 months    Wt Readings from Last 1 Encounters:  12/05/23 (!) 304 lb 12.8 oz (138.3 kg)         Passed - eGFR in normal range and within 360 days    GFR calc Af Amer  Date Value Ref Range Status  12/04/2017 >60 >60 mL/min Final    Comment:    (NOTE) The eGFR has been calculated using the CKD EPI equation. This calculation has not been validated in all clinical situations. eGFR's persistently <60 mL/min signify possible Chronic Kidney Disease.    GFR, Estimated  Date Value Ref Range Status  11/23/2023 >60 >60 mL/min Final    Comment:    (NOTE) Calculated using the CKD-EPI Creatinine Equation (2021)    eGFR  Date Value Ref Range Status  05/19/2023 72 >59 mL/min/1.73 Final         Passed - Cr in normal range and within 360 days    Creatinine, Ser  Date Value Ref Range Status  11/23/2023 0.93 0.44 - 1.00 mg/dL Final         Passed - Last BP in normal range    BP Readings from Last 1 Encounters:  12/05/23 130/85         Passed - Valid encounter within last 6 months    Recent Outpatient Visits           3 months ago Encounter for weight management   [facility], Amy J, NP   4 months ago Encounter for weight management   [place] [facility], [place], NP   9 months ago Annual physical exam   [facility] at [facility], [place], NP   1 year ago Encounter to establish care   [facility] at [facility], Greig PARAS, NP       Future Appointments             In 1 week Tobie Arleta SQUIBB, MD [facility] [place]   In 2 months Lorren Greig PARAS, NP [facility] at [facility]   In 4 months Alm Delon SAILOR, DO [facility]

## 2024-03-12 ENCOUNTER — Encounter (HOSPITAL_COMMUNITY): Payer: Self-pay

## 2024-03-12 ENCOUNTER — Encounter (HOSPITAL_COMMUNITY): Payer: Self-pay | Admitting: Licensed Clinical Social Worker

## 2024-03-12 ENCOUNTER — Ambulatory Visit (INDEPENDENT_AMBULATORY_CARE_PROVIDER_SITE_OTHER): Payer: Self-pay | Admitting: Licensed Clinical Social Worker

## 2024-03-12 DIAGNOSIS — Z91199 Patient's noncompliance with other medical treatment and regimen due to unspecified reason: Secondary | ICD-10-CM

## 2024-03-12 NOTE — Progress Notes (Signed)
 THERAPIST PROGRESS NOTE   Session Date: 03/12/2024  Session Time: 0804 - 0810 Virtual Visit via Video Note  I connected with Misty Moses on 03/12/24 at  8:00 AM EDT by a video enabled telemedicine application and verified that I am speaking with the correct person using two identifiers.  Location: Patient: Home Provider: Home Office   I discussed the limitations of evaluation and management by telemedicine and the availability of in person appointments. The patient expressed understanding and agreed to proceed.   The patient was advised to call back or seek an in-person evaluation if the symptoms worsen or if the condition fails to improve as anticipated.  I provided 6 minutes of non-face-to-face time during this encounter.  Participation Level: Active  Behavioral Response: CasualAlertEuthymic  Type of Therapy: Case Mgmt/Discharge  Treatment Goals addressed:   Initial (5) - Increase coping skills to manage depression and improve ability to perform daily activities (OP Depression) - Saleah will identify cognitive patterns and beliefs that support depression (OP Depression) - Improve the management of my moods and depression so that I am able to interact with others better (OP Depression) - Report a decrease in anxiety symptoms as evidenced by an overall reduction in anxiety score by a minimum of 25% on the Generalized Anxiety Disorder Scale (GAD-7) (Anxiety) - Find ways to calm myself down when my anxiety becomes overwhelming (Anxiety)  Progress Towards Goals: Not Progressing  Interventions: CBT, Motivational Interviewing, and Solution Focused  Summary: Misty Moses is a 23 y.o. female with psych history of MDD, GAD, and hx of Bipolar, presenting for follow-up therapy session in efforts to improve management of anxious, depressive, and mood dysregulation symptoms.   Patient openly engaged in introductory check-in, apologizing for frequent missed appt's over the past two  months, sharing of barriers to making appt's being due to work schedule. Pt expressed feeling as though she has been doing well without engaging in therapy consistently over the past two months, proving agreeable to discharge and receptive to receiving list of community providers that she can connect with in order to explore services if so desired in the future.   Suicidal/Homicidal: Nowithout intent/plan  Therapist Response:  Clinician openly greeted pt upon joining virtual visit, assessing presenting moods and affect, and engaging in introductory check-in. Expressed understanding and empathy of schedule challenges, relaying today's appt will be considered pt's 3rd no-show due to inability to meet for greater than , informing pt of both cancellation and discharge policies. Advised pt to ensure she keeps scheduled med man appt on 8/18, as well as advised to connect with community provider if she desires to pursue OPT in the future. Clinician reassessed severity of presenting sxs, and presence of any safety concerns.   Plan: Discharge from care due to inconsistent engagement in tx and seek tx via outside provider if desired. Office to provider list of community providers via email.  Diagnosis:  Encounter Diagnosis  Name Primary?   No-show for appointment Yes     Collaboration of Care: Other front office to mail official discharge notice and email list of community providers for pt to explore if desired.  Patient/Guardian was advised Release of Information must be obtained prior to any record release in order to collaborate their care with an outside provider. Patient/Guardian was advised if they have not already done so to contact the registration department to sign all necessary forms in order for us  to release information regarding their care.   Consent: Patient/Guardian gives verbal consent for  treatment and assignment of benefits for services provided during this visit. Patient/Guardian  expressed understanding and agreed to proceed.   Misty Moses, MSW, LCSW 03/12/2024,  8:15 AM

## 2024-03-15 ENCOUNTER — Ambulatory Visit (HOSPITAL_BASED_OUTPATIENT_CLINIC_OR_DEPARTMENT_OTHER): Admitting: Family

## 2024-03-15 DIAGNOSIS — F331 Major depressive disorder, recurrent, moderate: Secondary | ICD-10-CM | POA: Diagnosis not present

## 2024-03-15 DIAGNOSIS — Z8659 Personal history of other mental and behavioral disorders: Secondary | ICD-10-CM | POA: Diagnosis not present

## 2024-03-15 DIAGNOSIS — F411 Generalized anxiety disorder: Secondary | ICD-10-CM

## 2024-03-15 MED ORDER — OXCARBAZEPINE 600 MG PO TABS
600.0000 mg | ORAL_TABLET | Freq: Two times a day (BID) | ORAL | 0 refills | Status: DC
Start: 2024-03-15 — End: 2024-05-17

## 2024-03-15 MED ORDER — HYDROXYZINE HCL 25 MG PO TABS: 25.0000 mg | ORAL_TABLET | Freq: Three times a day (TID) | ORAL | 0 refills | Status: DC | PRN

## 2024-03-15 MED ORDER — TRAZODONE HCL 150 MG PO TABS: 150.0000 mg | ORAL_TABLET | Freq: Every evening | ORAL | 0 refills | Status: DC | PRN

## 2024-03-15 MED ORDER — ESCITALOPRAM OXALATE 10 MG PO TABS: 10.0000 mg | ORAL_TABLET | Freq: Every day | ORAL | 0 refills | Status: DC

## 2024-03-15 MED ORDER — TOPIRAMATE 50 MG PO TABS
50.0000 mg | ORAL_TABLET | Freq: Every day | ORAL | 0 refills | Status: DC
Start: 2024-03-15 — End: 2024-05-17

## 2024-03-15 NOTE — Progress Notes (Signed)
 [facility] MD/PA/NP OP Progress Note  03/15/2024 2:54 PM Misty Moses  MRN:  [id number]  Chief Complaint:  Medication Management   HPI:  Misty Moses 23 year old African-American female presents for medication management follow-up appointment.  She was seen and evaluated face-to-face by this provider.  Misty Moses presents accompanied by her mother to this visit.  Transferred care from psychiatrist [place].  Reports she was previously followed by [facility] however, due to insurance she transferred care.    Misty Moses has a documented history related to bipolar disorder, generalized anxiety disorder, major depressive disorder, attention deficit disorder and auditory processing disorder.  She is currently prescribed Lexapro  10 mg nightly, Trileptal  600 mg p.o. twice daily, trazodone  150 mg nightly and Topamax  50 mg p.o. nightly.  She reports she has been taking and tolerating medication well.  Denying any medication side effects.   Misty Moses denies depression or depressive symptoms at this visit.  Does endorse feeling overwhelmed and stressed related to her current employer.  States she works through 12-hour shifts and gets overwhelmed and frustrated easily.  She is requesting a referral to adult ADHD testing in order to be restarted on medications.  She was provided with additional outpatient resources for mind path and/or [place] attention specialist.  She appeared receptive to plan.  Reports she currently resides with her mother.  No concerns related to suicidal or homicidal ideations.  Denies auditory or visual hallucinations.  Reports a fair appetite.  States she is resting well throughout the night.  Patient to follow-up for 3 months for medication management.  No other concerns noted at this visit.   Visit Diagnosis:    ICD-10-CM   1. MDD (major depressive disorder), recurrent episode, moderate (HCC)  F33.1 escitalopram  (LEXAPRO ) 10 MG tablet    oxcarbazepine  (TRILEPTAL ) 600 MG tablet    topiramate   (TOPAMAX ) 50 MG tablet    traZODone  (DESYREL ) 150 MG tablet    2. GAD (generalized anxiety disorder)  F41.1 escitalopram  (LEXAPRO ) 10 MG tablet    hydrOXYzine  (ATARAX ) 25 MG tablet    3. History of bipolar disorder  Z86.59 oxcarbazepine  (TRILEPTAL ) 600 MG tablet    topiramate  (TOPAMAX ) 50 MG tablet      Past Psychiatric History:   Past Medical History:  Past Medical History:  Diagnosis Date   ADD (attention deficit disorder with hyperactivity)    Anxiety    Astigmatism    Back pain    Bipolar 1 disorder (HCC)    Borderline diabetes    TAKES METFORMIN  AS PRECAUTION   Constipation    Depression    Excessive anger    EXPLOSIVE ANGER MANAGEMENT ISSUES   High blood pressure    Lactose intolerance     Past Surgical History:  Procedure Laterality Date   ANTERIOR CRUCIATE LIGAMENT REPAIR Right 06/04/2017   Procedure: Right knee arthroscopic hamsting anterior cruciate ligament reconstruction with partial  medial menisectomy;  Surgeon: Sharl Selinda Dover, MD;  [facility]:  Medical Center;  Service: Orthopedics;  Laterality: Right;   TONSILLECTOMY      Family Psychiatric History:   Family History:  Family History  Problem Relation Age of Onset   Hypertension Mother    Migraines Mother    Anxiety disorder Mother    ADD / ADHD Mother    Depression Mother    Bipolar disorder Mother    Seizures Neg Hx    Autism Neg Hx    Schizophrenia Neg Hx     Social History:  Social History  Socioeconomic History   Marital status: Single    Spouse name: Not on file   Number of children: Not on file   Years of education: Not on file   Highest education level: Not on file  Occupational History   Occupation: Oncologist  Tobacco Use   Smoking status: Never    Passive exposure: Never   Smokeless tobacco: Never  Vaping Use   Vaping status: Never Used  Substance and Sexual Activity   Alcohol use: No   Drug use: No   Sexual activity: Yes    Birth  control/protection: Condom  Other Topics Concern   Not on file  Social History Narrative   LIVES WITH MOTHER AND BROTHER   IN 10TH GRADE at [employer]    NORMAL BIRTH HISTORY   She enjoys listening to music, talking, and watching videos   Social Drivers of Health   Financial Resource Strain: Low Risk  (05/19/2023)   Overall Financial Resource Strain (CARDIA)    Difficulty of Paying Living Expenses: Not hard at all  Food Insecurity: No Food Insecurity (05/19/2023)   Hunger Vital Sign    Worried About Running Out of Food in the Last Year: Never true    Ran Out of Food in the Last Year: Never true  Transportation Needs: No Transportation Needs (05/19/2023)   PRAPARE - Administrator, Civil Service (Medical): No    Lack of Transportation (Non-Medical): No  Physical Activity: Insufficiently Active (05/19/2023)   Exercise Vital Sign    Days of Exercise per Week: 2 days    Minutes of Exercise per Session: 20 min  Stress: No Stress Concern Present (05/19/2023)   [employer] of Occupational Health - Occupational Stress Questionnaire    Feeling of Stress : Not at all  Social Connections: Socially Isolated (05/19/2023)   Social Connection and Isolation Panel    Frequency of Communication with Friends and Family: More than three times a week    Frequency of Social Gatherings with Friends and Family: More than three times a week    Attends Religious Services: Never    Database administrator or Organizations: No    Attends Banker Meetings: Never    Marital Status: Never married    Allergies:  Allergies  Allergen Reactions   Hydrocodone Other (See Comments)    Dizziness  hydrocodone   Levonorgestrel-Ethinyl Estrad Other (See Comments)   Pollen Extract Cough    Metabolic Disorder Labs: Lab Results  Component Value Date   HGBA1C 5.5 05/19/2023   MPG 111 04/22/2013   Lab Results  Component Value Date   PROLACTIN 53.4 04/22/2013   Lab Results   Component Value Date   CHOL 175 05/19/2023   TRIG 68 05/19/2023   HDL 64 05/19/2023   CHOLHDL 2.7 05/19/2023   VLDL 11 04/22/2013   LDLCALC 98 05/19/2023   LDLCALC 82 05/24/2021   Lab Results  Component Value Date   TSH 0.985 05/19/2023   TSH 0.899 05/24/2021    Therapeutic Level Labs: No results found for: LITHIUM No results found for: VALPROATE No results found for: CBMZ  Current Medications: Current Outpatient Medications  Medication Sig Dispense Refill   azelastine  (ASTELIN ) 0.1 % nasal spray Place 1 spray into both nostrils 2 (two) times daily as needed. Use in each nostril as directed (Patient not taking: Reported on 11/14/2023) 30 mL 5   cetirizine  (ZYRTEC  ALLERGY ) 10 MG tablet Take 1 tablet (10 mg total) by  mouth daily. 30 tablet 5   EPINEPHrine  (EPIPEN  2-PAK) 0.3 mg/0.3 mL IJ SOAJ injection Inject 0.3 mg into the muscle as needed for anaphylaxis. 2 each 1   escitalopram  (LEXAPRO ) 10 MG tablet Take 1 tablet (10 mg total) by mouth at bedtime. 60 tablet 0   fluticasone  (FLONASE ) 50 MCG/ACT nasal spray Place 2 sprays into both nostrils daily. (Patient not taking: Reported on 11/14/2023) 16 g 5   hydrocortisone  2.5 % cream Apply twice daily for flare, maximum 10 days. (Patient not taking: Reported on 11/14/2023) 30 g 1   hydrOXYzine  (ATARAX ) 25 MG tablet Take 1 tablet (25 mg total) by mouth 3 (three) times daily as needed for anxiety. 90 tablet 0   Iron , Ferrous Sulfate , 325 (65 Fe) MG TABS Take 325 mg by mouth daily. (Patient not taking: Reported on 11/14/2023) 90 tablet 0   ondansetron  (ZOFRAN -ODT) 4 MG disintegrating tablet Take 1 tablet (4 mg total) by mouth every 8 (eight) hours as needed for nausea or vomiting. 20 tablet 0   oxcarbazepine  (TRILEPTAL ) 600 MG tablet Take 1 tablet (600 mg total) by mouth 2 (two) times daily. 60 tablet 0   phentermine  30 MG capsule Take 1 capsule (30 mg total) by mouth every morning. 30 capsule 0   Serdexmethylphen-Dexmethylphen (AZSTARYS)  39.2-7.8 MG CAPS Take 1 capsule by mouth daily. (Patient not taking: Reported on 11/14/2023)     topiramate  (TOPAMAX ) 50 MG tablet Take 1 tablet (50 mg total) by mouth at bedtime. 60 tablet 0   traZODone  (DESYREL ) 150 MG tablet Take 1 tablet (150 mg total) by mouth at bedtime as needed for sleep. 60 tablet 0   No current facility-administered medications for this visit.     Musculoskeletal: Strength & Muscle Tone: within normal limits Gait & Station: normal Patient leans: N/A  Psychiatric Specialty Exam: Review of Systems  Blood pressure 135/85, pulse 89, weight 289 lb (131.1 kg).Body mass index is 48.84 kg/m.  General Appearance: Casual  Eye Contact:  Good  Speech:  Clear and Coherent  Volume:  Normal  Mood:  Anxious and Depressed  Affect:  Congruent  Thought Process:  Coherent  Orientation:  Full (Time, Place, and Person)  Thought Content: Logical   Suicidal Thoughts:  No  Homicidal Thoughts:  No  Memory:  Immediate;   Good Recent;   Good  Judgement:  Good  Insight:  Good  Psychomotor Activity:  Normal  Concentration:  Concentration: Good  Recall:  Good  Fund of Knowledge: Good  Language: Good  Akathisia:  No  Handed:  Right  AIMS (if indicated): not done  Assets:  Communication Skills Desire for Improvement  ADL's:  Intact  Cognition: WNL  Sleep:  Good   Screenings: AIMS    Flowsheet Row [facility]  AIMS Total Score 0   AUDIT    Flowsheet Row Ad[facility] (Discharged[facility]  Alcohol Use Disorder Identification Test Final Score (AUDIT) 0   GAD-7    Flowsheet Row Counselor from 02/06/2024 in [facility] from 01/16/2024 in [facility] [place] Counselor from 01/02/2024 in [facility] [place] Counselor from  12/19/2023 in [facility] at [facility] Visit from 12/05/2023 in [facility] at [facility]  Total GAD-7 Score 5 3 2 12 15    PHQ2-9    Flowsheet  Row Counselor from 02/06/2024 in [facility] at [facility] from 01/16/2024 in [facility] from 01/02/2024 in [facility] at [facility] from 12/19/2023 in [facility] at [facility] Visit from 12/05/2023 in [facility] at [facility]  PHQ-2 Total Score 0 0 0 3 2  PHQ-9 Total Score 5 0 0 8 12   Flowsheet Row Counselor from 11/28/2023 in [facility] at [facility] ED from 11/23/2023 in [facility] Department at [facility] UC from 11/14/2023 in [facility] at [facility])  C-SSRS RISK CATEGORY No Risk No Risk No Risk     Assessment and Plan: Misty Moses 23 year old African-American female presents for medication management follow-up appointment.  Transferred care from psychiatrist [place].  Patient reports overall her mood has been stable.  States she has been compliant with medication.  Chart reviewed recently seen and evaluated by therapist J. Moses, she is seeking additional outpatient resources for adult ADHD testing.  No concerns related to sleep and appetite.  Reports she continues to struggle with her weight she has a documented prescription for phentermine  and is seeking to get medications refilled.  States medication helps with her ADHD.  No other documented concerns at this visit.  Refilled psychotropic medications.  Patient to follow-up 3 months for medication adherence/tolerability.  Collaboration of Care: Collaboration of Care: Continue her current medication regimen.  Follow-up with primary care for additional refills with controlled  medications.  Patient/Guardian was advised Release of Information must be obtained prior to any record release in order to collaborate their care with an outside provider. Patient/Guardian was advised if they have not already done so to contact the registration department to sign all necessary forms in order for us  to release information regarding their care.   Consent: Patient/Guardian gives verbal consent for treatment and assignment of benefits for services provided during this visit. Patient/Guardian expressed understanding and agreed to proceed.    Staci LOISE Kerns, NP 03/15/2024, 2:54 PM

## 2024-03-17 NOTE — Telephone Encounter (Signed)
 Pt scheduled

## 2024-03-23 ENCOUNTER — Ambulatory Visit: Payer: Medicare Other | Admitting: Internal Medicine

## 2024-03-23 ENCOUNTER — Other Ambulatory Visit (HOSPITAL_COMMUNITY): Payer: Self-pay | Admitting: Family

## 2024-03-23 DIAGNOSIS — F411 Generalized anxiety disorder: Secondary | ICD-10-CM

## 2024-03-23 DIAGNOSIS — Z8659 Personal history of other mental and behavioral disorders: Secondary | ICD-10-CM

## 2024-03-23 DIAGNOSIS — F331 Major depressive disorder, recurrent, moderate: Secondary | ICD-10-CM

## 2024-03-24 ENCOUNTER — Encounter: Payer: Self-pay | Admitting: Family

## 2024-03-24 ENCOUNTER — Ambulatory Visit (INDEPENDENT_AMBULATORY_CARE_PROVIDER_SITE_OTHER): Admitting: Family

## 2024-03-24 VITALS — BP 135/85 | HR 77 | Temp 98.0°F | Resp 16 | Ht 64.5 in | Wt 298.4 lb

## 2024-03-24 DIAGNOSIS — Z6841 Body Mass Index (BMI) 40.0 and over, adult: Secondary | ICD-10-CM | POA: Diagnosis not present

## 2024-03-24 DIAGNOSIS — Z79810 Long term (current) use of selective estrogen receptor modulators (SERMs): Secondary | ICD-10-CM | POA: Diagnosis not present

## 2024-03-24 DIAGNOSIS — Z7689 Persons encountering health services in other specified circumstances: Secondary | ICD-10-CM

## 2024-03-24 DIAGNOSIS — E669 Obesity, unspecified: Secondary | ICD-10-CM | POA: Diagnosis not present

## 2024-03-24 MED ORDER — PHENTERMINE HCL 30 MG PO CAPS: 30.0000 mg | ORAL_CAPSULE | ORAL | 0 refills | Status: DC

## 2024-03-24 NOTE — Progress Notes (Signed)
 Patient ID: Misty Moses, female    DOB: March 24, 2001  MRN: [id number]  CC: Weight Check  Subjective: Misty Moses is a 23 y.o. female who presents for weight check.  Her concerns today include:  - Needs refill of Phentermine . States she could improve eating habits.  - Established with Psychiatry. She denies thoughts of self-harm, suicidal ideations, homicidal ideations.  Patient Active Problem List   Diagnosis Date Noted   Unspecified mood (affective) disorder (HCC) 10/29/2023   History of bipolar disorder 10/27/2023   MDD (major depressive disorder), recurrent episode, moderate (HCC) 08/27/2023   History of ADHD 08/27/2023   Anemia 05/20/2023   Patellofemoral disorders, right knee 03/08/2021   Body mass index (BMI) 50.0-59.9, adult (HCC) 03/08/2021   Obesity 03/08/2021   Pain in right knee 03/06/2021   History of eustachian tube dysfunction 05/04/2020   Abnormal auditory perception of both ears 07/31/2018   Excessive cerumen in both ear canals 06/11/2018   Gait disturbance 10/14/2017   Weakness 10/14/2017   Nonintractable episodic headache 07/16/2017   New ACL tear, right, initial encounter 06/04/2017   Acute medial meniscus tear of right knee 06/04/2017   Cognitive developmental delay 05/19/2017   Callus of foot 03/15/2016   Gastroesophageal reflux disease without esophagitis 09/20/2015   Iron  deficiency anemia secondary to inadequate dietary iron  intake 09/20/2015   Morbid obesity (HCC) 08/25/2015   Generalized anxiety disorder 04/22/2013   Auditory processing disorder 04/22/2013   Severe episode of recurrent major depressive disorder, without psychotic features (HCC) 04/22/2013   Nexplanon insertion 03/16/2013   Contraception 03/16/2013     Current Outpatient Medications on File Prior to Visit  Medication Sig Dispense Refill   cetirizine  (ZYRTEC  ALLERGY ) 10 MG tablet Take 1 tablet (10 mg total) by mouth daily. 30 tablet 5   EPINEPHrine  (EPIPEN  2-PAK) 0.3  mg/0.3 mL IJ SOAJ injection Inject 0.3 mg into the muscle as needed for anaphylaxis. 2 each 1   escitalopram  (LEXAPRO ) 10 MG tablet Take 1 tablet (10 mg total) by mouth at bedtime. 60 tablet 0   hydrOXYzine  (ATARAX ) 25 MG tablet Take 1 tablet (25 mg total) by mouth 3 (three) times daily as needed for anxiety. 90 tablet 0   ondansetron  (ZOFRAN -ODT) 4 MG disintegrating tablet Take 1 tablet (4 mg total) by mouth every 8 (eight) hours as needed for nausea or vomiting. 20 tablet 0   oxcarbazepine  (TRILEPTAL ) 600 MG tablet Take 1 tablet (600 mg total) by mouth 2 (two) times daily. 60 tablet 0   topiramate  (TOPAMAX ) 50 MG tablet Take 1 tablet (50 mg total) by mouth at bedtime. 60 tablet 0   traZODone  (DESYREL ) 150 MG tablet Take 1 tablet (150 mg total) by mouth at bedtime as needed for sleep. 60 tablet 0   azelastine  (ASTELIN ) 0.1 % nasal spray Place 1 spray into both nostrils 2 (two) times daily as needed. Use in each nostril as directed (Patient not taking: Reported on 11/14/2023) 30 mL 5   fluticasone  (FLONASE ) 50 MCG/ACT nasal spray Place 2 sprays into both nostrils daily. (Patient not taking: Reported on 11/14/2023) 16 g 5   hydrocortisone  2.5 % cream Apply twice daily for flare, maximum 10 days. (Patient not taking: Reported on 11/14/2023) 30 g 1   Iron , Ferrous Sulfate , 325 (65 Fe) MG TABS Take 325 mg by mouth daily. (Patient not taking: Reported on 11/14/2023) 90 tablet 0   Serdexmethylphen-Dexmethylphen (AZSTARYS) 39.2-7.8 MG CAPS Take 1 capsule by mouth daily. (Patient not taking: Reported on  11/14/2023)     No current facility-administered medications on file prior to visit.    Allergies  Allergen Reactions   Hydrocodone Other (See Comments)    Dizziness  hydrocodone   Levonorgestrel-Ethinyl Estrad Other (See Comments)   Pollen Extract Cough    Social History   Socioeconomic History   Marital status: Single    Spouse name: Not on file   Number of children: Not on file   Years of  education: Not on file   Highest education level: Not on file  Occupational History   Occupation: Oncologist  Tobacco Use   Smoking status: Never    Passive exposure: Never   Smokeless tobacco: Never  Vaping Use   Vaping status: Never Used  Substance and Sexual Activity   Alcohol use: No   Drug use: No   Sexual activity: Yes    Birth control/protection: Condom  Other Topics Concern   Not on file  Social History Narrative   LIVES WITH MOTHER AND BROTHER   IN 10TH GRADE at [employer]    NORMAL BIRTH HISTORY   She enjoys listening to music, talking, and watching videos   Social Drivers of Health   Financial Resource Strain: Low Risk  (05/19/2023)   Overall Financial Resource Strain (CARDIA)    Difficulty of Paying Living Expenses: Not hard at all  Food Insecurity: No Food Insecurity (05/19/2023)   Hunger Vital Sign    Worried About Running Out of Food in the Last Year: Never true    Ran Out of Food in the Last Year: Never true  Transportation Needs: No Transportation Needs (05/19/2023)   PRAPARE - Administrator, Civil Service (Medical): No    Lack of Transportation (Non-Medical): No  Physical Activity: Insufficiently Active (05/19/2023)   Exercise Vital Sign    Days of Exercise per Week: 2 days    Minutes of Exercise per Session: 20 min  Stress: No Stress Concern Present (05/19/2023)   [employer] of Occupational Health - Occupational Stress Questionnaire    Feeling of Stress : Not at all  Social Connections: Socially Isolated (05/19/2023)   Social Connection and Isolation Panel    Frequency of Communication with Friends and Family: More than three times a week    Frequency of Social Gatherings with Friends and Family: More than three times a week    Attends Religious Services: Never    Database administrator or Organizations: No    Attends Banker Meetings: Never    Marital Status: Never married  Intimate Partner Violence:  Not At Risk (05/19/2023)   Humiliation, Afraid, Rape, and Kick questionnaire    Fear of Current or Ex-Partner: No    Emotionally Abused: No    Physically Abused: No    Sexually Abused: No    Family History  Problem Relation Age of Onset   Hypertension Mother    Migraines Mother    Anxiety disorder Mother    ADD / ADHD Mother    Depression Mother    Bipolar disorder Mother    Seizures Neg Hx    Autism Neg Hx    Schizophrenia Neg Hx     Past Surgical History:  Procedure Laterality Date   ANTERIOR CRUCIATE LIGAMENT REPAIR Right 06/04/2017   Procedure: Right knee arthroscopic hamsting anterior cruciate ligament reconstruction with partial  medial menisectomy;  Surgeon: Sharl Selinda Dover, MD;  Location: [facility] [place];  Service: Orthopedics;  Laterality: Right;  TONSILLECTOMY      ROS: Review of Systems Negative except as stated above  PHYSICAL EXAM: BP 135/85   Pulse 77   Temp 98 F (36.7 C) (Oral)   Resp 16   Ht 5' 4.5 (1.638 m)   Wt 298 lb 6.4 oz (135.4 kg)   LMP 03/13/2024 (Exact Date)   SpO2 98%   BMI 50.43 kg/m   Wt Readings from Last 3 Encounters:  03/24/24 298 lb 6.4 oz (135.4 kg)  12/05/23 (!) 304 lb 12.8 oz (138.3 kg)  11/07/23 (!) 312 lb 6.4 oz (141.7 kg)   Physical Exam HENT:     Head: Normocephalic and atraumatic.     Nose: Nose normal.     Mouth/Throat:     Mouth: Mucous membranes are moist.     Pharynx: Oropharynx is clear.  Eyes:     Extraocular Movements: Extraocular movements intact.     Conjunctiva/sclera: Conjunctivae normal.     Pupils: Pupils are equal, round, and reactive to light.  Cardiovascular:     Rate and Rhythm: Normal rate and regular rhythm.     Pulses: Normal pulses.     Heart sounds: Normal heart sounds.  Pulmonary:     Effort: Pulmonary effort is normal.     Breath sounds: Normal breath sounds.  Musculoskeletal:        General: Normal range of motion.     Cervical back: Normal range of motion and neck  supple.  Neurological:     General: No focal deficit present.     Mental Status: She is alert and oriented to person, place, and time.  Psychiatric:        Mood and Affect: Mood normal.        Behavior: Behavior normal.     ASSESSMENT AND PLAN: 1. Encounter for weight management (Primary) 2. BMI 50.0-59.9, adult [facility]) - Patient lost 6 pounds since previous weight. - Phentermine  as prescribed. Counseled on medication adherence/adverse effects.  - I did check the [place]  prescription drug database.  - Follow-up with primary provider in 4 weeks or sooner if needed. - phentermine  30 MG capsule; Take 1 capsule (30 mg total) by mouth every morning.  Dispense: 30 capsule; Refill: 0   Patient was given the opportunity to ask questions.  Patient verbalized understanding of the plan and was able to repeat key elements of the plan. Patient was given clear instructions to go to Emergency Department or return to medical center if symptoms don't improve, worsen, or new problems develop.The patient verbalized understanding.   Requested Prescriptions   Signed Prescriptions Disp Refills   phentermine  30 MG capsule 30 capsule 0    Sig: Take 1 capsule (30 mg total) by mouth every morning.    Return in about 4 weeks (around 04/21/2024) for Follow-Up or next available weight check.  Greig JINNY Drones, NP

## 2024-03-24 NOTE — Progress Notes (Signed)
 Patient scored a 10 on the PHQ-9, Patient scored a 11 on GAD-7

## 2024-04-27 ENCOUNTER — Encounter: Admitting: Family

## 2024-04-27 NOTE — Progress Notes (Signed)
 Erroneous encounter-disregard

## 2024-04-30 ENCOUNTER — Other Ambulatory Visit (HOSPITAL_COMMUNITY): Payer: Self-pay | Admitting: Family

## 2024-04-30 DIAGNOSIS — F411 Generalized anxiety disorder: Secondary | ICD-10-CM

## 2024-05-03 ENCOUNTER — Other Ambulatory Visit (HOSPITAL_COMMUNITY): Payer: Self-pay

## 2024-05-03 DIAGNOSIS — F411 Generalized anxiety disorder: Secondary | ICD-10-CM

## 2024-05-03 MED ORDER — HYDROXYZINE HCL 25 MG PO TABS: 25.0000 mg | ORAL_TABLET | Freq: Three times a day (TID) | ORAL | 0 refills | Status: DC | PRN

## 2024-05-17 ENCOUNTER — Encounter (HOSPITAL_COMMUNITY): Payer: Self-pay | Admitting: Family

## 2024-05-17 ENCOUNTER — Ambulatory Visit (HOSPITAL_BASED_OUTPATIENT_CLINIC_OR_DEPARTMENT_OTHER): Admitting: Family

## 2024-05-17 ENCOUNTER — Other Ambulatory Visit: Payer: Self-pay

## 2024-05-17 DIAGNOSIS — Z8659 Personal history of other mental and behavioral disorders: Secondary | ICD-10-CM | POA: Diagnosis not present

## 2024-05-17 DIAGNOSIS — F411 Generalized anxiety disorder: Secondary | ICD-10-CM | POA: Diagnosis not present

## 2024-05-17 DIAGNOSIS — F331 Major depressive disorder, recurrent, moderate: Secondary | ICD-10-CM | POA: Diagnosis not present

## 2024-05-17 MED ORDER — TRAZODONE HCL 150 MG PO TABS
150.0000 mg | ORAL_TABLET | Freq: Every evening | ORAL | 0 refills | Status: AC | PRN
Start: 2024-05-17 — End: 2024-07-16

## 2024-05-17 MED ORDER — OXCARBAZEPINE 600 MG PO TABS: 600.0000 mg | ORAL_TABLET | Freq: Two times a day (BID) | ORAL | 0 refills | Status: DC

## 2024-05-17 MED ORDER — ESCITALOPRAM OXALATE 10 MG PO TABS: 10.0000 mg | ORAL_TABLET | Freq: Every day | ORAL | 0 refills | Status: DC

## 2024-05-17 MED ORDER — HYDROXYZINE HCL 25 MG PO TABS: 25.0000 mg | ORAL_TABLET | Freq: Three times a day (TID) | ORAL | 0 refills | Status: DC | PRN

## 2024-05-17 MED ORDER — TOPIRAMATE 50 MG PO TABS: 50.0000 mg | ORAL_TABLET | Freq: Every day | ORAL | 0 refills | Status: DC
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
## 2024-05-18 ENCOUNTER — Encounter: Payer: Medicare Other | Admitting: Family

## 2024-05-21 ENCOUNTER — Ambulatory Visit (INDEPENDENT_AMBULATORY_CARE_PROVIDER_SITE_OTHER): Admitting: Family

## 2024-05-21 VITALS — BP 131/89 | HR 88 | Temp 98.1°F | Resp 16 | Ht 65.0 in | Wt 299.2 lb

## 2024-05-21 DIAGNOSIS — Z0000 Encounter for general adult medical examination without abnormal findings: Secondary | ICD-10-CM

## 2024-05-21 DIAGNOSIS — Z1329 Encounter for screening for other suspected endocrine disorder: Secondary | ICD-10-CM

## 2024-05-21 DIAGNOSIS — Z13220 Encounter for screening for lipoid disorders: Secondary | ICD-10-CM

## 2024-05-21 DIAGNOSIS — Z13228 Encounter for screening for other metabolic disorders: Secondary | ICD-10-CM

## 2024-05-21 DIAGNOSIS — Z130 Encounter for screening for diseases of the blood and blood-forming organs and certain disorders involving the immune mechanism: Secondary | ICD-10-CM

## 2024-05-21 DIAGNOSIS — R635 Abnormal weight gain: Secondary | ICD-10-CM

## 2024-05-21 DIAGNOSIS — Z124 Encounter for screening for malignant neoplasm of cervix: Secondary | ICD-10-CM

## 2024-05-21 DIAGNOSIS — Z131 Encounter for screening for diabetes mellitus: Secondary | ICD-10-CM

## 2024-05-21 MED ORDER — PHENTERMINE HCL 37.5 MG PO CAPS: 37.5000 mg | ORAL_CAPSULE | ORAL | 0 refills | Status: DC

## 2024-05-21 NOTE — Progress Notes (Signed)
 Patient wants to talk about anemia, patient scored a 9 on the PHQ--9,

## 2024-05-21 NOTE — Progress Notes (Signed)
 Patient ID: Misty Moses, female    DOB: 2001-05-01  MRN: [id number]  CC: Annual Exam  Subjective: Misty Moses is a 23 y.o. female who presents for annual exam.   Her concerns today include:  - Requests referral to Gynecology for cervical cancer screening.  - States when she goes to donate plasma she is deferred due to low iron .  - Doing well on Phentermine , no issues/concerns. - Anxiety depression. Established with Psychiatry. She denies thoughts of self-harm, suicidal ideations, homicidal ideations.  Patient Active Problem List   Diagnosis Date Noted   Unspecified mood (affective) disorder 10/29/2023   History of bipolar disorder 10/27/2023   MDD (major depressive disorder), recurrent episode, moderate (HCC) 08/27/2023   History of ADHD 08/27/2023   Anemia 05/20/2023   Patellofemoral disorders, right knee 03/08/2021   Body mass index (BMI) 50.0-59.9, adult (HCC) 03/08/2021   Obesity 03/08/2021   Pain in right knee 03/06/2021   History of eustachian tube dysfunction 05/04/2020   Abnormal auditory perception of both ears 07/31/2018   Excessive cerumen in both ear canals 06/11/2018   Gait disturbance 10/14/2017   Weakness 10/14/2017   Nonintractable episodic headache 07/16/2017   New ACL tear, right, initial encounter 06/04/2017   Acute medial meniscus tear of right knee 06/04/2017   Cognitive developmental delay 05/19/2017   Callus of foot 03/15/2016   Gastroesophageal reflux disease without esophagitis 09/20/2015   Iron  deficiency anemia secondary to inadequate dietary iron  intake 09/20/2015   Morbid obesity (HCC) 08/25/2015   Generalized anxiety disorder 04/22/2013   Auditory processing disorder 04/22/2013   Severe episode of recurrent major depressive disorder, without psychotic features (HCC) 04/22/2013   Nexplanon insertion 03/16/2013   Contraception 03/16/2013     Current Outpatient Medications on File Prior to Visit  Medication Sig Dispense Refill    EPINEPHrine  (EPIPEN  2-PAK) 0.3 mg/0.3 mL IJ SOAJ injection Inject 0.3 mg into the muscle as needed for anaphylaxis. 2 each 1   escitalopram  (LEXAPRO ) 10 MG tablet Take 1 tablet (10 mg total) by mouth at bedtime. 60 tablet 0   hydrOXYzine  (ATARAX ) 25 MG tablet Take 1 tablet (25 mg total) by mouth 3 (three) times daily as needed for anxiety. 90 tablet 0   ondansetron  (ZOFRAN -ODT) 4 MG disintegrating tablet Take 1 tablet (4 mg total) by mouth every 8 (eight) hours as needed for nausea or vomiting. 20 tablet 0   oxcarbazepine  (TRILEPTAL ) 600 MG tablet Take 1 tablet (600 mg total) by mouth 2 (two) times daily. 60 tablet 0   topiramate  (TOPAMAX ) 50 MG tablet Take 1 tablet (50 mg total) by mouth at bedtime. 60 tablet 0   traZODone  (DESYREL ) 150 MG tablet Take 1 tablet (150 mg total) by mouth at bedtime as needed for sleep. 60 tablet 0   azelastine  (ASTELIN ) 0.1 % nasal spray Place 1 spray into both nostrils 2 (two) times daily as needed. Use in each nostril as directed (Patient not taking: Reported on 05/17/2024) 30 mL 5   cetirizine  (ZYRTEC  ALLERGY ) 10 MG tablet Take 1 tablet (10 mg total) by mouth daily. (Patient not taking: Reported on 05/17/2024) 30 tablet 5   fluticasone  (FLONASE ) 50 MCG/ACT nasal spray Place 2 sprays into both nostrils daily. (Patient not taking: Reported on 05/17/2024) 16 g 5   hydrocortisone  2.5 % cream Apply twice daily for flare, maximum 10 days. (Patient not taking: Reported on 05/17/2024) 30 g 1   Iron , Ferrous Sulfate , 325 (65 Fe) MG TABS Take 325 mg by  mouth daily. (Patient not taking: Reported on 11/14/2023) 90 tablet 0   Serdexmethylphen-Dexmethylphen (AZSTARYS) 39.2-7.8 MG CAPS Take 1 capsule by mouth daily. (Patient not taking: Reported on 05/17/2024)     No current facility-administered medications on file prior to visit.    Allergies  Allergen Reactions   Hydrocodone Other (See Comments)    Dizziness  hydrocodone   Levonorgestrel-Ethinyl Estrad Other (See Comments)    Pollen Extract Cough    Social History   Socioeconomic History   Marital status: Single    Spouse name: Not on file   Number of children: Not on file   Years of education: Not on file   Highest education level: 12th grade  Occupational History   Occupation: Oncologist  Tobacco Use   Smoking status: Never    Passive exposure: Never   Smokeless tobacco: Never  Vaping Use   Vaping status: Never Used  Substance and Sexual Activity   Alcohol use: No   Drug use: No   Sexual activity: Yes    Birth control/protection: Condom  Other Topics Concern   Not on file  Social History Narrative   LIVES WITH MOTHER AND BROTHER   IN 10TH GRADE at [employer]    NORMAL BIRTH HISTORY   She enjoys listening to music, talking, and watching videos   Social Drivers of Health   Financial Resource Strain: Low Risk  (05/21/2024)   Overall Financial Resource Strain (CARDIA)    Difficulty of Paying Living Expenses: Not hard at all  Food Insecurity: No Food Insecurity (05/21/2024)   Hunger Vital Sign    Worried About Running Out of Food in the Last Year: Never true    Ran Out of Food in the Last Year: Never true  Transportation Needs: Unmet Transportation Needs (05/21/2024)   PRAPARE - Administrator, Civil Service (Medical): Yes    Lack of Transportation (Non-Medical): No  Physical Activity: Inactive (05/21/2024)   Exercise Vital Sign    Days of Exercise per Week: 0 days    Minutes of Exercise per Session: Not on file  Stress: No Stress Concern Present (05/21/2024)   [employer] of Occupational Health - Occupational Stress Questionnaire    Feeling of Stress: Only a little  Social Connections: Socially Isolated (05/21/2024)   Social Connection and Isolation Panel    Frequency of Communication with Friends and Family: More than three times a week    Frequency of Social Gatherings with Friends and Family: Never    Attends Religious Services: Never    Automotive engineer or Organizations: No    Attends Engineer, structural: Not on file    Marital Status: Never married  Intimate Partner Violence: Not At Risk (05/19/2023)   Humiliation, Afraid, Rape, and Kick questionnaire    Fear of Current or Ex-Partner: No    Emotionally Abused: No    Physically Abused: No    Sexually Abused: No    Family History  Problem Relation Age of Onset   Hypertension Mother    Migraines Mother    Anxiety disorder Mother    ADD / ADHD Mother    Depression Mother    Bipolar disorder Mother    Seizures Neg Hx    Autism Neg Hx    Schizophrenia Neg Hx     Past Surgical History:  Procedure Laterality Date   ANTERIOR CRUCIATE LIGAMENT REPAIR Right 06/04/2017   Procedure: Right knee arthroscopic hamsting anterior cruciate ligament reconstruction  with partial  medial menisectomy;  Surgeon: Sharl Selinda Dover, MD;  Location: [facility];  Service: Orthopedics;  Laterality: Right;   TONSILLECTOMY      ROS: Review of Systems Negative except as stated above  PHYSICAL EXAM: BP 131/89   Pulse 88   Temp 98.1 F (36.7 C) (Oral)   Resp 16   Ht 5' 5 (1.651 m)   Wt 299 lb 3.2 oz (135.7 kg)   LMP 05/08/2024 (Exact Date)   SpO2 98%   BMI 49.79 kg/m   Wt Readings from Last 3 Encounters:  05/21/24 299 lb 3.2 oz (135.7 kg)  03/24/24 298 lb 6.4 oz (135.4 kg)  12/05/23 (!) 304 lb 12.8 oz (138.3 kg)   Physical Exam HENT:     Head: Normocephalic and atraumatic.     Right Ear: Tympanic membrane, ear canal and external ear normal.     Left Ear: Tympanic membrane, ear canal and external ear normal.     Nose: Nose normal.     Mouth/Throat:     Mouth: Mucous membranes are moist.     Pharynx: Oropharynx is clear.  Eyes:     Extraocular Movements: Extraocular movements intact.     Conjunctiva/sclera: Conjunctivae normal.     Pupils: Pupils are equal, round, and reactive to light.  Neck:     Thyroid : No thyroid  mass, thyromegaly or  thyroid  tenderness.  Cardiovascular:     Rate and Rhythm: Normal rate and regular rhythm.     Pulses: Normal pulses.     Heart sounds: Normal heart sounds.  Pulmonary:     Effort: Pulmonary effort is normal.     Breath sounds: Normal breath sounds.  Chest:     Comments: Patient declined.  Abdominal:     General: Bowel sounds are normal.     Palpations: Abdomen is soft.  Genitourinary:    Comments: Patient declined. Musculoskeletal:        General: Normal range of motion.     Right shoulder: Normal.     Left shoulder: Normal.     Right upper arm: Normal.     Left upper arm: Normal.     Right elbow: Normal.     Left elbow: Normal.     Right forearm: Normal.     Left forearm: Normal.     Right wrist: Normal.     Left wrist: Normal.     Right hand: Normal.     Left hand: Normal.     Cervical back: Normal, normal range of motion and neck supple.     Thoracic back: Normal.     Lumbar back: Normal.     Right hip: Normal.     Left hip: Normal.     Right upper leg: Normal.     Left upper leg: Normal.     Right knee: Normal.     Left knee: Normal.     Right lower leg: Normal.     Left lower leg: Normal.     Right ankle: Normal.     Left ankle: Normal.     Right foot: Normal.     Left foot: Normal.  Skin:    General: Skin is warm and dry.     Capillary Refill: Capillary refill takes less than 2 seconds.  Neurological:     General: No focal deficit present.     Mental Status: She is alert and oriented to person, place, and time.  Psychiatric:  Mood and Affect: Mood normal.        Behavior: Behavior normal.     ASSESSMENT AND PLAN: 1. Annual physical exam (Primary) - Counseled on 150 minutes of exercise per week as tolerated, healthy eating (including decreased daily intake of saturated fats, cholesterol, added sugars, sodium), STI prevention, and routine healthcare maintenance.  2. Screening for metabolic disorder - Routine screening.  - CMP14+EGFR  3.  Screening for deficiency anemia - Routine screening.  - CBC - Iron , TIBC and Ferritin Panel  4. Diabetes mellitus screening - Routine screening.  - Hemoglobin A1c  5. Screening cholesterol level - Routine screening.  - Lipid panel  6. Thyroid  disorder screen - Routine screening.  - TSH  7. Cervical cancer screening - Referral to Gynecology for evaluation/management. - Ambulatory referral to Gynecology  8. Weight gain - Patient gained 1 pound since previous office visit.  - Increase Phentermine  from 30 mg to 37.5 mg as prescribed. Counseled on medication adherence/adverse effects.  - Follow-up with primary provider in 4 weeks or sooner if needed.  - phentermine  37.5 MG capsule; Take 1 capsule (37.5 mg total) by mouth every morning.  Dispense: 30 capsule; Refill: 0   Patient was given the opportunity to ask questions.  Patient verbalized understanding of the plan and was able to repeat key elements of the plan. Patient was given clear instructions to go to Emergency Department or return to medical center if symptoms don't improve, worsen, or new problems develop.The patient verbalized understanding.   Orders Placed This Encounter  Procedures   CBC   Lipid panel   CMP14+EGFR   Hemoglobin A1c   TSH   Iron , TIBC and Ferritin Panel   Ambulatory referral to Gynecology     Requested Prescriptions   Signed Prescriptions Disp Refills   phentermine  37.5 MG capsule 30 capsule 0    Sig: Take 1 capsule (37.5 mg total) by mouth every morning.    Return in about 1 year (around 05/21/2025) for Physical per patient preference.  Greig JINNY Drones, NP

## 2024-05-22 LAB — CBC
Hematocrit: 37.7 % (ref 34.0–46.6)
Hemoglobin: 10.5 g/dL — ABNORMAL LOW (ref 11.1–15.9)
MCH: 20.2 pg — ABNORMAL LOW (ref 26.6–33.0)
MCHC: 27.9 g/dL — ABNORMAL LOW (ref 31.5–35.7)
MCV: 73 fL — ABNORMAL LOW (ref 79–97)
Platelets: 467 x10E3/uL — ABNORMAL HIGH (ref 150–450)
RBC: 5.19 x10E6/uL (ref 3.77–5.28)
RDW: 15.6 % — ABNORMAL HIGH (ref 11.7–15.4)
WBC: 16.3 x10E3/uL — ABNORMAL HIGH (ref 3.4–10.8)

## 2024-05-22 LAB — TSH: TSH: 0.749 u[IU]/mL (ref 0.450–4.500)

## 2024-05-22 LAB — IRON,TIBC AND FERRITIN PANEL
Ferritin: 63 ng/mL (ref 15–150)
Iron Saturation: 12 % — ABNORMAL LOW (ref 15–55)
Iron: 40 ug/dL (ref 27–159)
Total Iron Binding Capacity: 334 ug/dL (ref 250–450)
UIBC: 294 ug/dL (ref 131–425)

## 2024-05-22 LAB — LIPID PANEL
Chol/HDL Ratio: 3.1 ratio (ref 0.0–4.4)
Cholesterol, Total: 156 mg/dL (ref 100–199)
HDL: 51 mg/dL (ref >39)
LDL Chol Calc (NIH): 83 mg/dL (ref 0–99)
Triglycerides: 124 mg/dL (ref 0–149)
VLDL Cholesterol Cal: 22 mg/dL (ref 5–40)

## 2024-05-22 LAB — CMP14+EGFR
ALT: 7 IU/L (ref 0–32)
AST: 13 IU/L (ref 0–40)
Albumin: 4.2 g/dL (ref 4.0–5.0)
Alkaline Phosphatase: 93 IU/L (ref 41–116)
BUN/Creatinine Ratio: 11 (ref 9–23)
BUN: 10 mg/dL (ref 6–20)
Bilirubin Total: <0.2 mg/dL (ref 0.0–1.2)
CO2: 21 mmol/L (ref 20–29)
Calcium: 9.4 mg/dL (ref 8.7–10.2)
Chloride: 103 mmol/L (ref 96–106)
Creatinine, Ser: 0.9 mg/dL (ref 0.57–1.00)
Globulin, Total: 3.3 g/dL (ref 1.5–4.5)
Glucose: 86 mg/dL (ref 70–99)
Potassium: 4.1 mmol/L (ref 3.5–5.2)
Sodium: 138 mmol/L (ref 134–144)
Total Protein: 7.5 g/dL (ref 6.0–8.5)
eGFR: 92 mL/min/1.73 (ref >59)

## 2024-05-22 LAB — HEMOGLOBIN A1C
Est. average glucose Bld gHb Est-mCnc: 114 mg/dL
Hgb A1c MFr Bld: 5.6 % (ref 4.8–5.6)

## 2024-05-24 ENCOUNTER — Ambulatory Visit: Payer: Self-pay | Admitting: Family

## 2024-05-24 DIAGNOSIS — E611 Iron deficiency: Secondary | ICD-10-CM

## 2024-05-24 DIAGNOSIS — D509 Iron deficiency anemia, unspecified: Secondary | ICD-10-CM

## 2024-06-07 ENCOUNTER — Encounter: Admitting: Family

## 2024-06-07 NOTE — Progress Notes (Signed)
 Erroneous encounter-disregard

## 2024-06-14 ENCOUNTER — Ambulatory Visit (HOSPITAL_COMMUNITY): Admitting: Family

## 2024-06-22 ENCOUNTER — Other Ambulatory Visit (HOSPITAL_COMMUNITY): Payer: Self-pay | Admitting: Family

## 2024-06-22 DIAGNOSIS — F331 Major depressive disorder, recurrent, moderate: Secondary | ICD-10-CM

## 2024-06-22 DIAGNOSIS — Z8659 Personal history of other mental and behavioral disorders: Secondary | ICD-10-CM

## 2024-06-28 ENCOUNTER — Other Ambulatory Visit (HOSPITAL_COMMUNITY): Payer: Self-pay

## 2024-06-28 DIAGNOSIS — F411 Generalized anxiety disorder: Secondary | ICD-10-CM

## 2024-06-28 DIAGNOSIS — F331 Major depressive disorder, recurrent, moderate: Secondary | ICD-10-CM

## 2024-06-28 DIAGNOSIS — Z8659 Personal history of other mental and behavioral disorders: Secondary | ICD-10-CM

## 2024-06-28 MED ORDER — OXCARBAZEPINE 600 MG PO TABS: 600.0000 mg | ORAL_TABLET | Freq: Two times a day (BID) | ORAL | 0 refills | Status: DC

## 2024-06-28 MED ORDER — HYDROXYZINE HCL 25 MG PO TABS: 25.0000 mg | ORAL_TABLET | Freq: Three times a day (TID) | ORAL | 0 refills | Status: DC | PRN

## 2024-07-12 ENCOUNTER — Inpatient Hospital Stay

## 2024-07-12 ENCOUNTER — Inpatient Hospital Stay: Attending: Hematology and Oncology | Admitting: Hematology and Oncology

## 2024-07-12 ENCOUNTER — Encounter: Payer: Self-pay | Admitting: Hematology and Oncology

## 2024-07-12 VITALS — BP 128/86 | HR 88 | Temp 98.5°F | Resp 18 | Ht 64.0 in | Wt 305.2 lb

## 2024-07-12 DIAGNOSIS — Z79899 Other long term (current) drug therapy: Secondary | ICD-10-CM | POA: Diagnosis not present

## 2024-07-12 DIAGNOSIS — D72829 Elevated white blood cell count, unspecified: Secondary | ICD-10-CM | POA: Insufficient documentation

## 2024-07-12 DIAGNOSIS — D508 Other iron deficiency anemias: Secondary | ICD-10-CM

## 2024-07-12 DIAGNOSIS — D72825 Bandemia: Secondary | ICD-10-CM

## 2024-07-12 LAB — IRON AND IRON BINDING CAPACITY (CC-WL,HP ONLY)
Iron: 30 ug/dL (ref 28–170)
Saturation Ratios: 7 % — ABNORMAL LOW (ref 10.4–31.8)
TIBC: 417 ug/dL (ref 250–450)
UIBC: 387 ug/dL

## 2024-07-12 LAB — CBC WITH DIFFERENTIAL (CANCER CENTER ONLY)
Abs Immature Granulocytes: 0.03 K/uL (ref 0.00–0.07)
Basophils Absolute: 0.1 K/uL (ref 0.0–0.1)
Basophils Relative: 1 %
Eosinophils Absolute: 0.6 K/uL — ABNORMAL HIGH (ref 0.0–0.5)
Eosinophils Relative: 5 %
HCT: 35 % — ABNORMAL LOW (ref 36.0–46.0)
Hemoglobin: 10.9 g/dL — ABNORMAL LOW (ref 12.0–15.0)
Immature Granulocytes: 0 %
Lymphocytes Relative: 19 %
Lymphs Abs: 2.4 K/uL (ref 0.7–4.0)
MCH: 21.4 pg — ABNORMAL LOW (ref 26.0–34.0)
MCHC: 31.1 g/dL (ref 30.0–36.0)
MCV: 68.8 fL — ABNORMAL LOW (ref 80.0–100.0)
Monocytes Absolute: 0.5 K/uL (ref 0.1–1.0)
Monocytes Relative: 4 %
Neutro Abs: 9 K/uL — ABNORMAL HIGH (ref 1.7–7.7)
Neutrophils Relative %: 71 %
Platelet Count: 369 K/uL (ref 150–400)
RBC: 5.09 MIL/uL (ref 3.87–5.11)
RDW: 16 % — ABNORMAL HIGH (ref 11.5–15.5)
Smear Review: NORMAL
WBC Count: 12.6 K/uL — ABNORMAL HIGH (ref 4.0–10.5)
nRBC: 0 % (ref 0.0–0.2)

## 2024-07-12 LAB — ABO/RH: ABO/RH(D): O POS

## 2024-07-12 LAB — FERRITIN: Ferritin: 45 ng/mL (ref 11–307)

## 2024-07-12 NOTE — Assessment & Plan Note (Addendum)
 She is referred here because of chronic iron  deficiency anemia of which she is symptomatic In going through her history, I suspect this is due to menorrhagia and inadequate intake of food with iron  She has tried to take oral iron  supplement for many months, complicated by nausea Repeat blood count is pending, we will call her with test results tomorrow If confirmed iron  deficiency anemia and intolerant to oral iron  supplement, I will schedule 2 doses of intravenous iron  Feraheme and repeat labs in 3 months Her total RBC count is high I suspect there might be a component of thalassemia I plan to repeat blood work with hemoglobin electrophoresis in the future Discussed side effects to be expected and she agreed

## 2024-07-12 NOTE — Progress Notes (Signed)
 Misty Moses  Patient Care Team: Misty Greig PARAS, NP as PCP - General (Nurse Practitioner)  ASSESSMENT & PLAN:  Iron  deficiency anemia secondary to inadequate dietary iron  intake She is referred here because of chronic iron  deficiency anemia of which she is symptomatic In going through her history, I suspect this is due to menorrhagia and inadequate intake of food with iron  She has tried to take oral iron  supplement for many months, complicated by nausea Repeat blood count is pending, we will call her with test results tomorrow If confirmed iron  deficiency anemia and intolerant to oral iron  supplement, I will schedule 2 doses of intravenous iron  Feraheme and repeat labs in 3 months Her total RBC count is high I suspect there might be a component of thalassemia I plan to repeat blood work with hemoglobin electrophoresis in the future Discussed side effects to be expected and she agreed  Leukocytosis She has chronic leukocytosis, predominantly neutrophilia I suspect this is related to class III obesity Observe only Orders Placed This Encounter  Procedures   CBC with Differential (Cancer Center Only)    Standing Status:   Standing    Number of Occurrences:   3    Expiration Date:   07/12/2025   Iron  and Iron  Binding Capacity ([facility] only)    Standing Status:   Standing    Number of Occurrences:   3    Expiration Date:   07/12/2025   Ferritin    Standing Status:   Standing    Number of Occurrences:   3    Expiration Date:   07/12/2025   ABO/Rh    Standing Status:   Future    Number of Occurrences:   1    Expiration Date:   07/12/2025    All questions were answered. The patient knows to call the clinic with any problems, questions or concerns.  The total time spent in the appointment was 60 minutes encounter with patients including review of chart and various tests results, discussions about plan of care and coordination of care plan  Misty Bedford,  MD 12/15/20253:58 PM   CHIEF COMPLAINTS/PURPOSE OF CONSULTATION:  Anemia and chronic leukocytosis  HISTORY OF PRESENTING ILLNESS:  Misty Moses 23 y.o. female is here because of anemia  She was found to have abnormal CBC from recent blood work I have the opportunity to review his CBC dated back to 2014 On April 21, 2013, hemoglobin is 10.4, MCV 69 and platelet count 424 On February 09, 2017, white count 16.6, hemoglobin 10.4, MCV 69.5 and platelet count 413 On October 14, 2017, white count 13, hemoglobin 10.2, MCV 69.4 and platelet count of 447 On May 24, 2021, white count 11.7, hemoglobin 10.4 and platelet count 414 On May 19, 2023, white count 14.2, hemoglobin 10.5 platelet count 438 On July 12, 2024, white count 12.6, hemoglobin 10.9 and platelet count 369  She denies recent chest pain on exertion, shortness of breath on minimal exertion, pre-syncopal episodes, or palpitations. She complained of excessive fatigue She had not noticed any recent bleeding such as hematuria or hematochezia She complained of rare epistaxis and menorrhagia The patient denies over the counter NSAID ingestion. She is not on antiplatelets agents.  She had no prior history or diagnosis of cancer. Her age appropriate screening programs are up-to-date. She denies any pica and eats a variety of diet.  However, in going through her diet history, she routinely skips breakfast She never donated blood or received blood  transfusion She has donated plasma frequently and was refused donation recently due to iron  deficiency The patient was prescribed oral iron  supplements and she takes iron  supplement daily but it causes significant nausea She denies recent infection  MEDICAL HISTORY:  Past Medical History:  Diagnosis Date   ADD (attention deficit disorder with hyperactivity)    Anxiety    Astigmatism    Back pain    Bipolar 1 disorder (HCC)    Borderline diabetes    TAKES METFORMIN  AS  PRECAUTION   Constipation    Depression    Excessive anger    EXPLOSIVE ANGER MANAGEMENT ISSUES   High blood pressure    Lactose intolerance     SURGICAL HISTORY: Past Surgical History:  Procedure Laterality Date   ANTERIOR CRUCIATE LIGAMENT REPAIR Right 06/04/2017   Procedure: Right knee arthroscopic hamsting anterior cruciate ligament reconstruction with partial  medial menisectomy;  Surgeon: Sharl Selinda Dover, MD;  Location: [facility];  Service: Orthopedics;  Laterality: Right;   TONSILLECTOMY      SOCIAL HISTORY: Social History   Socioeconomic History   Marital status: Single    Spouse name: Not on file   Number of children: Not on file   Years of education: Not on file   Highest education level: 12th grade  Occupational History   Occupation: Oncologist  Tobacco Use   Smoking status: Never    Passive exposure: Never   Smokeless tobacco: Never  Vaping Use   Vaping status: Never Used  Substance and Sexual Activity   Alcohol use: No   Drug use: No   Sexual activity: Yes    Birth control/protection: Condom  Other Topics Concern   Not on file  Social History Narrative   LIVES WITH MOTHER AND BROTHER   IN 10TH GRADE at [employer]    NORMAL BIRTH HISTORY   She enjoys listening to music, talking, and watching videos   Social Drivers of Health   Tobacco Use: Low Risk (07/12/2024)   Patient History    Smoking Tobacco Use: Never    Smokeless Tobacco Use: Never    Passive Exposure: Never  Financial Resource Strain: Low Risk (05/21/2024)   Overall Financial Resource Strain (CARDIA)    Difficulty of Paying Living Expenses: Not hard at all  Food Insecurity: No Food Insecurity (05/21/2024)   Epic    Worried About Radiation Protection Practitioner of Food in the Last Year: Never true    Ran Out of Food in the Last Year: Never true  Transportation Needs: Unmet Transportation Needs (05/21/2024)   Epic    Lack of Transportation (Medical): Yes    Lack of  Transportation (Non-Medical): No  Physical Activity: Inactive (05/21/2024)   Exercise Vital Sign    Days of Exercise per Week: 0 days    Minutes of Exercise per Session: Not on file  Stress: No Stress Concern Present (05/21/2024)   [employer] of Occupational Health - Occupational Stress Questionnaire    Feeling of Stress: Only a little  Social Connections: Socially Isolated (05/21/2024)   Social Connection and Isolation Panel    Frequency of Communication with Friends and Family: More than three times a week    Frequency of Social Gatherings with Friends and Family: Never    Attends Religious Services: Never    Database Administrator or Organizations: No    Attends Engineer, Structural: Not on file    Marital Status: Never married  Intimate Partner Violence: Not At  Risk (05/19/2023)   Humiliation, Afraid, Rape, and Kick questionnaire    Fear of Current or Ex-Partner: No    Emotionally Abused: No    Physically Abused: No    Sexually Abused: No  Depression (PHQ2-9): Medium Risk (05/21/2024)   Depression (PHQ2-9)    PHQ-2 Score: 9  Alcohol Screen: Low Risk (05/21/2024)   Alcohol Screen    Last Alcohol Screening Score (AUDIT): 0  Housing: Low Risk (05/21/2024)   Epic    Unable to Pay for Housing in the Last Year: No    Number of Times Moved in the Last Year: 1    Homeless in the Last Year: No  Utilities: Not At Risk (05/19/2023)   AHC Utilities    Threatened with loss of utilities: No  Health Literacy: Adequate Health Literacy (05/19/2023)   B1300 Health Literacy    Frequency of need for help with medical instructions: Never    FAMILY HISTORY: Family History  Problem Relation Age of Onset   Hypertension Mother    Migraines Mother    Anxiety disorder Mother    ADD / ADHD Mother    Depression Mother    Bipolar disorder Mother    Seizures Neg Hx    Autism Neg Hx    Schizophrenia Neg Hx     ALLERGIES:  is allergic to hydrocodone, levonorgestrel-ethinyl  estrad, and pollen extract.  MEDICATIONS:  Current Outpatient Medications  Medication Sig Dispense Refill   EPINEPHrine  (EPIPEN  2-PAK) 0.3 mg/0.3 mL IJ SOAJ injection Inject 0.3 mg into the muscle as needed for anaphylaxis. 2 each 1   escitalopram  (LEXAPRO ) 10 MG tablet Take 1 tablet (10 mg total) by mouth at bedtime. 60 tablet 0   hydrOXYzine  (ATARAX ) 25 MG tablet Take 1 tablet (25 mg total) by mouth 3 (three) times daily as needed for anxiety. 90 tablet 0   oxcarbazepine  (TRILEPTAL ) 600 MG tablet Take 1 tablet (600 mg total) by mouth 2 (two) times daily. 60 tablet 0   phentermine  37.5 MG capsule Take 1 capsule (37.5 mg total) by mouth every morning. 30 capsule 0   topiramate  (TOPAMAX ) 50 MG tablet Take 1 tablet (50 mg total) by mouth at bedtime. 60 tablet 0   traZODone  (DESYREL ) 150 MG tablet Take 1 tablet (150 mg total) by mouth at bedtime as needed for sleep. 60 tablet 0   No current facility-administered medications for this visit.    REVIEW OF SYSTEMS:   Constitutional: Denies fevers, chills or abnormal night sweats Eyes: Denies blurriness of vision, double vision or watery eyes Ears, nose, mouth, throat, and face: Denies mucositis or sore throat Respiratory: Denies cough, dyspnea or wheezes Cardiovascular: Denies palpitation, chest discomfort or lower extremity swelling Gastrointestinal:  Denies nausea, heartburn or change in bowel habits Skin: Denies abnormal skin rashes Lymphatics: Denies new lymphadenopathy or easy bruising Neurological:Denies numbness, tingling or new weaknesses Behavioral/Psych: Mood is stable, no new changes  All other systems were reviewed with the patient and are negative.  PHYSICAL EXAMINATION: ECOG PERFORMANCE STATUS: 1 - Symptomatic but completely ambulatory  Vitals:   07/12/24 1402  BP: 128/86  Pulse: 88  Resp: 18  Temp: 98.5 F (36.9 C)  SpO2: 100%   Filed Weights   07/12/24 1402  Weight: (!) 305 lb 3.2 oz (138.4 kg)    GENERAL:alert,  no distress and comfortable SKIN: skin color, texture, turgor are normal, no rashes or significant lesions EYES: normal, conjunctiva are pink and non-injected, sclera clear OROPHARYNX:no exudate, no erythema and lips,  buccal mucosa, and tongue normal  NECK: supple, thyroid  normal size, non-tender, without nodularity LYMPH:  no palpable lymphadenopathy in the cervical, axillary or inguinal LUNGS: clear to auscultation and percussion with normal breathing effort HEART: regular rate & rhythm and no murmurs and no lower extremity edema ABDOMEN:abdomen soft, non-tender and normal bowel sounds Musculoskeletal:no cyanosis of digits and no clubbing  PSYCH: alert & oriented x 3 with fluent speech NEURO: no focal motor/sensory deficits

## 2024-07-12 NOTE — Assessment & Plan Note (Signed)
 She has chronic leukocytosis, predominantly neutrophilia I suspect this is related to class III obesity Observe only

## 2024-07-13 ENCOUNTER — Other Ambulatory Visit (HOSPITAL_COMMUNITY): Payer: Self-pay | Admitting: Hematology and Oncology

## 2024-07-13 ENCOUNTER — Telehealth: Payer: Self-pay

## 2024-07-13 ENCOUNTER — Other Ambulatory Visit: Payer: Self-pay | Admitting: Hematology and Oncology

## 2024-07-13 ENCOUNTER — Ambulatory Visit: Admitting: Dermatology

## 2024-07-13 ENCOUNTER — Telehealth (HOSPITAL_COMMUNITY): Payer: Self-pay | Admitting: Pharmacy Technician

## 2024-07-13 DIAGNOSIS — D509 Iron deficiency anemia, unspecified: Secondary | ICD-10-CM | POA: Insufficient documentation

## 2024-07-13 DIAGNOSIS — D508 Other iron deficiency anemias: Secondary | ICD-10-CM

## 2024-07-13 NOTE — Telephone Encounter (Signed)
Called and left below message. Ask her to call the office back. 

## 2024-07-13 NOTE — Telephone Encounter (Signed)
 Called her back and given below message from Dr. Lonn. She verbalized understanding and lab appt moved to 6/1. She is aware of appt.

## 2024-07-13 NOTE — Telephone Encounter (Signed)
-----   Message from [name], MD sent at 07/13/2024  9:52 AM EST ----- Tell her iron  studies are low I created orders, [facility] will call for infusion Can you reschedule her labs to be done 2 weeks prior to future appt and let her know we need to run tests early in the future

## 2024-07-13 NOTE — Telephone Encounter (Signed)
 Auth Submission: NO AUTH NEEDED Site of care: [facility] Payer: MEDICARE A/B, [place] MEDICAID Medication & CPT/J Code(s) submitted: Feraheme (ferumoxytol) [id number] Diagnosis Code: D50.9, D50.8 Route of submission (phone, fax, portal):  Phone # Fax # Auth type: Buy/Bill HB Units/visits requested: 510mg  x 2 doses Reference number:  Approval from: 07/13/2024 to 10/11/24    Dagoberto Armour, CPhT [facility] Phone: [phone] 07/13/2024

## 2024-07-15 ENCOUNTER — Other Ambulatory Visit (HOSPITAL_COMMUNITY): Payer: Self-pay

## 2024-07-15 DIAGNOSIS — F331 Major depressive disorder, recurrent, moderate: Secondary | ICD-10-CM

## 2024-07-15 DIAGNOSIS — F411 Generalized anxiety disorder: Secondary | ICD-10-CM

## 2024-07-15 MED ORDER — TRAZODONE HCL 150 MG PO TABS: 150.0000 mg | ORAL_TABLET | Freq: Every evening | ORAL | 0 refills | Status: DC | PRN

## 2024-07-15 MED ORDER — ESCITALOPRAM OXALATE 10 MG PO TABS: 10.0000 mg | ORAL_TABLET | Freq: Every day | ORAL | 0 refills | Status: DC

## 2024-07-16 ENCOUNTER — Other Ambulatory Visit (HOSPITAL_COMMUNITY): Payer: Self-pay | Admitting: Family

## 2024-07-16 DIAGNOSIS — F331 Major depressive disorder, recurrent, moderate: Secondary | ICD-10-CM

## 2024-07-16 DIAGNOSIS — Z8659 Personal history of other mental and behavioral disorders: Secondary | ICD-10-CM

## 2024-07-19 ENCOUNTER — Encounter: Payer: Self-pay | Admitting: Family

## 2024-07-19 ENCOUNTER — Encounter (HOSPITAL_COMMUNITY)

## 2024-07-19 ENCOUNTER — Ambulatory Visit: Admitting: Family

## 2024-07-19 VITALS — BP 129/84 | HR 95 | Temp 98.8°F | Resp 16 | Ht 64.0 in | Wt 309.6 lb

## 2024-07-19 VITALS — BP 118/86 | HR 75 | Temp 98.8°F | Resp 16

## 2024-07-19 DIAGNOSIS — D509 Iron deficiency anemia, unspecified: Secondary | ICD-10-CM | POA: Insufficient documentation

## 2024-07-19 DIAGNOSIS — D508 Other iron deficiency anemias: Secondary | ICD-10-CM | POA: Diagnosis present

## 2024-07-19 DIAGNOSIS — F32A Depression, unspecified: Secondary | ICD-10-CM | POA: Diagnosis not present

## 2024-07-19 DIAGNOSIS — R635 Abnormal weight gain: Secondary | ICD-10-CM | POA: Diagnosis not present

## 2024-07-19 DIAGNOSIS — F419 Anxiety disorder, unspecified: Secondary | ICD-10-CM

## 2024-07-19 MED ORDER — DIPHENHYDRAMINE HCL 25 MG PO CAPS
ORAL_CAPSULE | ORAL | Status: AC
  Filled 2024-07-19: qty 1

## 2024-07-19 MED ORDER — DIPHENHYDRAMINE HCL 25 MG PO CAPS
25.0000 mg | ORAL_CAPSULE | Freq: Once | ORAL | Status: AC
  Administered 2024-07-19: 25 mg via ORAL

## 2024-07-19 MED ORDER — PHENTERMINE HCL 37.5 MG PO CAPS: 37.5000 mg | ORAL_CAPSULE | ORAL | 0 refills | Status: AC

## 2024-07-19 MED ORDER — ACETAMINOPHEN 325 MG PO TABS
ORAL_TABLET | ORAL | Status: AC
  Filled 2024-07-19: qty 2

## 2024-07-19 MED ORDER — SODIUM CHLORIDE 0.9 % IV SOLN
510.0000 mg | Freq: Once | INTRAVENOUS | Status: AC
  Administered 2024-07-19: 510 mg via INTRAVENOUS
  Filled 2024-07-19: qty 510

## 2024-07-19 MED ORDER — ACETAMINOPHEN 325 MG PO TABS
650.0000 mg | ORAL_TABLET | Freq: Once | ORAL | Status: AC
  Administered 2024-07-19: 650 mg via ORAL

## 2024-07-19 NOTE — Progress Notes (Signed)
 One month follow up, patient scored a 9 on her GAD-7, medication refill

## 2024-07-19 NOTE — Progress Notes (Signed)
 "   Patient ID: Misty Moses, female    DOB: Aug 21, 2000  MRN: [id number]  CC: Weight Check  Subjective: Misty Moses is a 23 y.o. female who presents for weight check.  Her concerns today include:  - Doing well on Phentermine , no issues/concerns.  - Established with Psychiatry. She denies thoughts of self-harm, suicidal ideations, homicidal ideations.  Patient Active Problem List   Diagnosis Date Noted   Iron  deficiency anemia, unspecified 07/13/2024   Leukocytosis 07/12/2024   Unspecified mood (affective) disorder 10/29/2023   History of bipolar disorder 10/27/2023   MDD (major depressive disorder), recurrent episode, moderate (HCC) 08/27/2023   History of ADHD 08/27/2023   Anemia 05/20/2023   Patellofemoral disorders, right knee 03/08/2021   Body mass index (BMI) 50.0-59.9, adult (HCC) 03/08/2021   Obesity 03/08/2021   Pain in right knee 03/06/2021   History of eustachian tube dysfunction 05/04/2020   Abnormal auditory perception of both ears 07/31/2018   Excessive cerumen in both ear canals 06/11/2018   Gait disturbance 10/14/2017   Weakness 10/14/2017   Nonintractable episodic headache 07/16/2017   New ACL tear, right, initial encounter 06/04/2017   Acute medial meniscus tear of right knee 06/04/2017   Cognitive developmental delay 05/19/2017   Callus of foot 03/15/2016   Gastroesophageal reflux disease without esophagitis 09/20/2015   Iron  deficiency anemia secondary to inadequate dietary iron  intake 09/20/2015   Morbid obesity (HCC) 08/25/2015   Generalized anxiety disorder 04/22/2013   Auditory processing disorder 04/22/2013   Severe episode of recurrent major depressive disorder, without psychotic features (HCC) 04/22/2013   Nexplanon insertion 03/16/2013   Contraception 03/16/2013     Medications Ordered Prior to Encounter[1]  Allergies[2]  Social History   Socioeconomic History   Marital status: Single    Spouse name: Not on file   Number of  children: Not on file   Years of education: Not on file   Highest education level: 12th grade  Occupational History   Occupation: Oncologist  Tobacco Use   Smoking status: Never    Passive exposure: Never   Smokeless tobacco: Never  Vaping Use   Vaping status: Never Used  Substance and Sexual Activity   Alcohol use: No   Drug use: No   Sexual activity: Yes    Birth control/protection: Condom  Other Topics Concern   Not on file  Social History Narrative   LIVES WITH MOTHER AND BROTHER   IN 10TH GRADE at [employer]    NORMAL BIRTH HISTORY   She enjoys listening to music, talking, and watching videos   Social Drivers of Health   Tobacco Use: Low Risk (07/19/2024)   Patient History    Smoking Tobacco Use: Never    Smokeless Tobacco Use: Never    Passive Exposure: Never  Financial Resource Strain: Low Risk (05/21/2024)   Overall Financial Resource Strain (CARDIA)    Difficulty of Paying Living Expenses: Not hard at all  Food Insecurity: No Food Insecurity (05/21/2024)   Epic    Worried About Programme Researcher, Broadcasting/film/video in the Last Year: Never true    [employer] of Food in the Last Year: Never true  Transportation Needs: Unmet Transportation Needs (05/21/2024)   Epic    Lack of Transportation (Medical): Yes    Lack of Transportation (Non-Medical): No  Physical Activity: Inactive (05/21/2024)   Exercise Vital Sign    Days of Exercise per Week: 0 days    Minutes of Exercise per Session: Not on file  Stress: No Stress Concern Present (05/21/2024)   [employer] of Occupational Health - Occupational Stress Questionnaire    Feeling of Stress: Only a little  Social Connections: Socially Isolated (05/21/2024)   Social Connection and Isolation Panel    Frequency of Communication with Friends and Family: More than three times a week    Frequency of Social Gatherings with Friends and Family: Never    Attends Religious Services: Never    Database Administrator or  Organizations: No    Attends Engineer, Structural: Not on file    Marital Status: Never married  Intimate Partner Violence: Not At Risk (05/19/2023)   Humiliation, Afraid, Rape, and Kick questionnaire    Fear of Current or Ex-Partner: No    Emotionally Abused: No    Physically Abused: No    Sexually Abused: No  Depression (PHQ2-9): Low Risk (07/19/2024)   Depression (PHQ2-9)    PHQ-2 Score: 1  Recent Concern: Depression (PHQ2-9) - Medium Risk (05/21/2024)   Depression (PHQ2-9)    PHQ-2 Score: 9  Alcohol Screen: Low Risk (05/21/2024)   Alcohol Screen    Last Alcohol Screening Score (AUDIT): 0  Housing: Low Risk (05/21/2024)   Epic    Unable to Pay for Housing in the Last Year: No    Number of Times Moved in the Last Year: 1    Homeless in the Last Year: No  Utilities: Not At Risk (05/19/2023)   AHC Utilities    Threatened with loss of utilities: No  Health Literacy: Adequate Health Literacy (05/19/2023)   B1300 Health Literacy    Frequency of need for help with medical instructions: Never    Family History  Problem Relation Age of Onset   Hypertension Mother    Migraines Mother    Anxiety disorder Mother    ADD / ADHD Mother    Depression Mother    Bipolar disorder Mother    Seizures Neg Hx    Autism Neg Hx    Schizophrenia Neg Hx     Past Surgical History:  Procedure Laterality Date   ANTERIOR CRUCIATE LIGAMENT REPAIR Right 06/04/2017   Procedure: Right knee arthroscopic hamsting anterior cruciate ligament reconstruction with partial  medial menisectomy;  Surgeon: Sharl Selinda Dover, MD;  Location: [facility];  Service: Orthopedics;  Laterality: Right;   TONSILLECTOMY      ROS: Review of Systems Negative except as stated above  PHYSICAL EXAM: BP 129/84   Pulse 95   Temp 98.8 F (37.1 C) (Oral)   Resp 16   Ht 5' 4 (1.626 m)   Wt (!) 309 lb 9.6 oz (140.4 kg)   LMP 07/07/2024 (Approximate)   SpO2 95%   BMI 53.14 kg/m   Wt  Readings from Last 3 Encounters:  07/19/24 (!) 309 lb 9.6 oz (140.4 kg)  07/12/24 (!) 305 lb 3.2 oz (138.4 kg)  05/21/24 299 lb 3.2 oz (135.7 kg)    Physical Exam HENT:     Head: Normocephalic and atraumatic.     Nose: Nose normal.     Mouth/Throat:     Mouth: Mucous membranes are moist.     Pharynx: Oropharynx is clear.  Eyes:     Extraocular Movements: Extraocular movements intact.     Conjunctiva/sclera: Conjunctivae normal.     Pupils: Pupils are equal, round, and reactive to light.  Cardiovascular:     Rate and Rhythm: Normal rate and regular rhythm.     Pulses: Normal pulses.  Heart sounds: Normal heart sounds.  Pulmonary:     Effort: Pulmonary effort is normal.     Breath sounds: Normal breath sounds.  Musculoskeletal:        General: Normal range of motion.     Cervical back: Normal range of motion and neck supple.  Neurological:     General: No focal deficit present.     Mental Status: She is alert and oriented to person, place, and time.  Psychiatric:        Mood and Affect: Mood normal.        Behavior: Behavior normal.     ASSESSMENT AND PLAN: 1. Weight gain (Primary) - Patient gained 4 pounds since previous office visit.  - Continue Phentermine  as prescribed. Counseled on medication adherence/adverse effects.  - Follow-up with primary provider in 4 weeks or sooner if needed.  - phentermine  37.5 MG capsule; Take 1 capsule (37.5 mg total) by mouth every morning.  Dispense: 30 capsule; Refill: 0  2. Anxiety and depression - Patient denies thoughts of self-harm, suicidal ideations, homicidal ideations. - Continue present management.  - Keep all scheduled appointments with established Psychiatry.   Patient was given the opportunity to ask questions.  Patient verbalized understanding of the plan and was able to repeat key elements of the plan. Patient was given clear instructions to go to Emergency Department or return to medical center if symptoms don't  improve, worsen, or new problems develop.The patient verbalized understanding.    Requested Prescriptions   Signed Prescriptions Disp Refills   phentermine  37.5 MG capsule 30 capsule 0    Sig: Take 1 capsule (37.5 mg total) by mouth every morning.    Return in about 4 weeks (around 08/16/2024) for Follow-Up or next available wieght check.  Greig JINNY Chute, NP      [1]  Current Outpatient Medications on File Prior to Visit  Medication Sig Dispense Refill   EPINEPHrine  (EPIPEN  2-PAK) 0.3 mg/0.3 mL IJ SOAJ injection Inject 0.3 mg into the muscle as needed for anaphylaxis. 2 each 1   escitalopram  (LEXAPRO ) 10 MG tablet Take 1 tablet (10 mg total) by mouth at bedtime. 60 tablet 0   hydrOXYzine  (ATARAX ) 25 MG tablet Take 1 tablet (25 mg total) by mouth 3 (three) times daily as needed for anxiety. 90 tablet 0   oxcarbazepine  (TRILEPTAL ) 600 MG tablet Take 1 tablet (600 mg total) by mouth 2 (two) times daily. 60 tablet 0   topiramate  (TOPAMAX ) 50 MG tablet Take 1 tablet (50 mg total) by mouth at bedtime. 60 tablet 0   traZODone  (DESYREL ) 150 MG tablet Take 1 tablet (150 mg total) by mouth at bedtime as needed for sleep. 60 tablet 0   No current facility-administered medications on file prior to visit.  [2]  Allergies Allergen Reactions   Hydrocodone Other (See Comments)    Dizziness  hydrocodone   Levonorgestrel-Ethinyl Estrad Other (See Comments)   Pollen Extract Cough   "

## 2024-07-26 ENCOUNTER — Encounter (HOSPITAL_COMMUNITY)
Admission: RE | Admit: 2024-07-26 | Discharge: 2024-07-26 | Disposition: A | Source: Ambulatory Visit | Attending: Hematology and Oncology

## 2024-07-26 VITALS — BP 144/91 | HR 75 | Temp 98.5°F | Resp 16

## 2024-07-26 DIAGNOSIS — D508 Other iron deficiency anemias: Secondary | ICD-10-CM

## 2024-07-26 DIAGNOSIS — D509 Iron deficiency anemia, unspecified: Secondary | ICD-10-CM

## 2024-07-26 MED ORDER — DIPHENHYDRAMINE HCL 25 MG PO CAPS
25.0000 mg | ORAL_CAPSULE | Freq: Once | ORAL | Status: AC
  Administered 2024-07-26: 25 mg via ORAL

## 2024-07-26 MED ORDER — DIPHENHYDRAMINE HCL 25 MG PO CAPS
ORAL_CAPSULE | ORAL | Status: AC
  Filled 2024-07-26: qty 1

## 2024-07-26 MED ORDER — SODIUM CHLORIDE 0.9 % IV SOLN
510.0000 mg | Freq: Once | INTRAVENOUS | Status: AC
  Administered 2024-07-26: 510 mg via INTRAVENOUS
  Filled 2024-07-26: qty 510

## 2024-07-26 MED ORDER — ACETAMINOPHEN 325 MG PO TABS
ORAL_TABLET | ORAL | Status: AC
  Filled 2024-07-26: qty 2

## 2024-07-26 MED ORDER — ACETAMINOPHEN 325 MG PO TABS
650.0000 mg | ORAL_TABLET | Freq: Once | ORAL | Status: AC
  Administered 2024-07-26: 650 mg via ORAL

## 2024-07-30 ENCOUNTER — Other Ambulatory Visit (HOSPITAL_COMMUNITY): Payer: Self-pay | Admitting: Family

## 2024-07-30 ENCOUNTER — Other Ambulatory Visit (HOSPITAL_COMMUNITY): Payer: Self-pay

## 2024-07-30 DIAGNOSIS — F411 Generalized anxiety disorder: Secondary | ICD-10-CM

## 2024-07-30 DIAGNOSIS — Z8659 Personal history of other mental and behavioral disorders: Secondary | ICD-10-CM

## 2024-07-30 DIAGNOSIS — F331 Major depressive disorder, recurrent, moderate: Secondary | ICD-10-CM

## 2024-07-30 MED ORDER — TOPIRAMATE 50 MG PO TABS: 50.0000 mg | ORAL_TABLET | Freq: Every day | ORAL | 0 refills | Status: DC

## 2024-08-05 ENCOUNTER — Ambulatory Visit (HOSPITAL_COMMUNITY): Admitting: Clinical

## 2024-08-09 ENCOUNTER — Other Ambulatory Visit (HOSPITAL_COMMUNITY): Payer: Self-pay | Admitting: *Deleted

## 2024-08-09 DIAGNOSIS — F411 Generalized anxiety disorder: Secondary | ICD-10-CM

## 2024-08-09 DIAGNOSIS — Z8659 Personal history of other mental and behavioral disorders: Secondary | ICD-10-CM

## 2024-08-09 DIAGNOSIS — F331 Major depressive disorder, recurrent, moderate: Secondary | ICD-10-CM

## 2024-08-09 MED ORDER — OXCARBAZEPINE 600 MG PO TABS: 600.0000 mg | ORAL_TABLET | Freq: Two times a day (BID) | ORAL | 0 refills | Status: DC

## 2024-08-09 MED ORDER — HYDROXYZINE HCL 25 MG PO TABS: 25.0000 mg | ORAL_TABLET | Freq: Three times a day (TID) | ORAL | 0 refills | Status: DC | PRN

## 2024-08-17 ENCOUNTER — Ambulatory Visit (HOSPITAL_COMMUNITY): Admitting: Family

## 2024-08-17 ENCOUNTER — Other Ambulatory Visit: Payer: Self-pay

## 2024-08-17 ENCOUNTER — Encounter (HOSPITAL_COMMUNITY): Payer: Self-pay | Admitting: Family

## 2024-08-17 DIAGNOSIS — Z8659 Personal history of other mental and behavioral disorders: Secondary | ICD-10-CM

## 2024-08-17 DIAGNOSIS — F411 Generalized anxiety disorder: Secondary | ICD-10-CM

## 2024-08-17 DIAGNOSIS — F331 Major depressive disorder, recurrent, moderate: Secondary | ICD-10-CM

## 2024-08-17 MED ORDER — TRAZODONE HCL 150 MG PO TABS: 150.0000 mg | ORAL_TABLET | Freq: Every evening | ORAL | 0 refills | Status: AC | PRN

## 2024-08-17 MED ORDER — ESCITALOPRAM OXALATE 10 MG PO TABS: 10.0000 mg | ORAL_TABLET | Freq: Every day | ORAL | 0 refills | Status: AC

## 2024-08-17 MED ORDER — OXCARBAZEPINE 600 MG PO TABS: 600.0000 mg | ORAL_TABLET | Freq: Two times a day (BID) | ORAL | 0 refills | Status: AC

## 2024-08-17 MED ORDER — TOPIRAMATE 50 MG PO TABS: 50.0000 mg | ORAL_TABLET | Freq: Every day | ORAL | 0 refills | Status: AC
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
## 2024-08-19 ENCOUNTER — Other Ambulatory Visit (HOSPITAL_COMMUNITY): Payer: Self-pay | Admitting: *Deleted

## 2024-08-19 DIAGNOSIS — F411 Generalized anxiety disorder: Secondary | ICD-10-CM

## 2024-08-19 MED ORDER — HYDROXYZINE HCL 25 MG PO TABS: 25.0000 mg | ORAL_TABLET | Freq: Three times a day (TID) | ORAL | 0 refills | Status: AC | PRN

## 2024-09-01 NOTE — Progress Notes (Unsigned)
 "   GYNECOLOGY ANNUAL PREVENTATIVE CARE ENCOUNTER NOTE  History:     Misty Moses is a 24 y.o. G0P0000 female here for a routine annual gynecologic exam.  Current complaints: ***.   Denies abnormal vaginal bleeding, discharge, pelvic pain, problems with intercourse or other gynecologic concerns.    Gynecologic History No LMP recorded. Contraception: {method:5051} Last Pap: ***. Result was {norm/abn:16337} with negative HPV Last Mammogram: ***.  Result was {norm/abn:16337} Last Colonoscopy: ***.  Result was {norm/abn:16337}  Obstetric History OB History  Gravida Para Term Preterm AB Living  0 0 0 0 0 0  SAB IAB Ectopic Multiple Live Births  0 0 0 0     Past Medical History:  Diagnosis Date   ADD (attention deficit disorder with hyperactivity)    Anxiety    Astigmatism    Back pain    Bipolar 1 disorder (HCC)    Borderline diabetes    TAKES METFORMIN  AS PRECAUTION   Constipation    Depression    Excessive anger    EXPLOSIVE ANGER MANAGEMENT ISSUES   High blood pressure    Lactose intolerance     Past Surgical History:  Procedure Laterality Date   ANTERIOR CRUCIATE LIGAMENT REPAIR Right 06/04/2017   Procedure: Right knee arthroscopic hamsting anterior cruciate ligament reconstruction with partial  medial menisectomy;  Surgeon: Sharl Selinda Dover, MD;  Location: [facility];  Service: Orthopedics;  Laterality: Right;   TONSILLECTOMY      Medications Ordered Prior to Encounter[1]  Allergies[2]  Social History:  reports that she has never smoked. She has never been exposed to tobacco smoke. She has never used smokeless tobacco. She reports that she does not drink alcohol and does not use drugs.  Family History  Problem Relation Age of Onset   Hypertension Mother    Migraines Mother    Anxiety disorder Mother    ADD / ADHD Mother    Depression Mother    Bipolar disorder Mother    Seizures Neg Hx    Autism Neg Hx    Schizophrenia Neg Hx      The following portions of the patient's history were reviewed and updated as appropriate: allergies, current medications, past family history, past medical history, past social history, past surgical history and problem list.  Review of Systems Pertinent items noted in HPI and remainder of comprehensive ROS otherwise negative.  Physical Exam:  There were no vitals taken for this visit. CONSTITUTIONAL: Well-developed, well-nourished female in no acute distress.  HENT:  Normocephalic, atraumatic, External right and left ear normal.  EYES: Conjunctivae and EOM are normal. Pupils are equal, round, and reactive to light. No scleral icterus.  NECK: Normal range of motion, supple, no masses.  Normal thyroid .  SKIN: Skin is warm and dry. No rash noted. Not diaphoretic. No erythema. No pallor. MUSCULOSKELETAL: Normal range of motion. No tenderness.  No cyanosis, clubbing, or edema. NEUROLOGIC: Alert and oriented to person, place, and time. Normal reflexes, muscle tone coordination.  PSYCHIATRIC: Normal mood and affect. Normal behavior. Normal judgment and thought content. CARDIOVASCULAR: Normal heart rate noted, regular rhythm RESPIRATORY: Clear to auscultation bilaterally. Effort and breath sounds normal, no problems with respiration noted. BREASTS: Symmetric in size. No masses, tenderness, skin changes, nipple drainage, or lymphadenopathy bilaterally. Performed in the presence of a chaperone. ABDOMEN: Soft, no distention noted.  No tenderness, rebound or guarding.  PELVIC: Normal appearing external genitalia and urethral meatus; normal appearing vaginal mucosa and cervix.  No abnormal  vaginal discharge noted.  Pap smear obtained.  Normal uterine size, no other palpable masses, no uterine or adnexal tenderness.  Performed in the presence of a chaperone.   Assessment and Plan:    There are no diagnoses linked to this encounter. Will follow up results of pap smear and manage  accordingly. Mammogram scheduled Colon cancer screening is up to date***Discussed need for colon cancer screening over the age 33. Colonoscopy recommended as this is the gold standard. Referral made to Gastroenterology for colonoscopy*** Patient declined colonoscopy, so discussed Cologuard.   Emphasized that positive Cologuard tests will need to be follow up with diagnostic colonoscopy. Cologuard ordered.***Patient will decide and let us  know her decision. Routine preventative health maintenance measures emphasized. Please refer to After Visit Summary for other counseling recommendations.      Rojean Sacramento, DO OB Fellow, Faculty [facility], [facility] 09/01/2024, 12:59 PM      [1]  Current Outpatient Medications on File Prior to Visit  Medication Sig Dispense Refill   EPINEPHrine  (EPIPEN  2-PAK) 0.3 mg/0.3 mL IJ SOAJ injection Inject 0.3 mg into the muscle as needed for anaphylaxis. 2 each 1   escitalopram  (LEXAPRO ) 10 MG tablet Take 1 tablet (10 mg total) by mouth at bedtime. 60 tablet 0   hydrOXYzine  (ATARAX ) 25 MG tablet Take 1 tablet (25 mg total) by mouth 3 (three) times daily as needed for anxiety. 90 tablet 0   oxcarbazepine  (TRILEPTAL ) 600 MG tablet Take 1 tablet (600 mg total) by mouth 2 (two) times daily. 30 tablet 0   phentermine  37.5 MG capsule Take 1 capsule (37.5 mg total) by mouth every morning. 30 capsule 0   topiramate  (TOPAMAX ) 50 MG tablet Take 1 tablet (50 mg total) by mouth at bedtime. 30 tablet 0   traZODone  (DESYREL ) 150 MG tablet Take 1 tablet (150 mg total) by mouth at bedtime as needed for sleep. 60 tablet 0   No current facility-administered medications on file prior to visit.  [2]  Allergies Allergen Reactions   Hydrocodone Other (See Comments)    Dizziness  hydrocodone   Levonorgestrel-Ethinyl Estrad Other (See Comments)   Pollen Extract Cough   "

## 2024-09-06 ENCOUNTER — Encounter: Payer: Self-pay | Admitting: Family Medicine

## 2024-09-06 DIAGNOSIS — Z01419 Encounter for gynecological examination (general) (routine) without abnormal findings: Secondary | ICD-10-CM

## 2024-09-09 ENCOUNTER — Ambulatory Visit (HOSPITAL_COMMUNITY): Admitting: Clinical

## 2024-10-18 ENCOUNTER — Ambulatory Visit: Payer: Self-pay | Admitting: Family

## 2024-11-16 ENCOUNTER — Telehealth (HOSPITAL_COMMUNITY): Admitting: Family

## 2024-12-07 ENCOUNTER — Ambulatory Visit: Admitting: Dermatology

## 2024-12-27 ENCOUNTER — Inpatient Hospital Stay

## 2025-01-10 ENCOUNTER — Inpatient Hospital Stay: Admitting: Hematology and Oncology

## 2025-01-10 ENCOUNTER — Inpatient Hospital Stay

## 2025-05-23 ENCOUNTER — Ambulatory Visit: Admitting: Family
